# Patient Record
Sex: Female | Born: 1945 | Race: White | Hispanic: No | Smoking: Never smoker
Health system: Southern US, Community
[De-identification: ages and names within clinical notes are randomized; demographics above are authoritative.]

## PROBLEM LIST (undated history)

## (undated) DIAGNOSIS — Z972 Presence of dental prosthetic device (complete) (partial): Secondary | ICD-10-CM

## (undated) DIAGNOSIS — E785 Hyperlipidemia, unspecified: Secondary | ICD-10-CM

## (undated) HISTORY — DX: Hyperlipidemia, unspecified: E78.5

## (undated) HISTORY — PX: LAPAROSCOPIC TUBAL LIGATION: SHX1937

## (undated) HISTORY — PX: COLONOSCOPY: SHX174

---

## 2011-07-14 ENCOUNTER — Ambulatory Visit: Payer: Self-pay | Admitting: Family Medicine

## 2011-07-19 ENCOUNTER — Ambulatory Visit: Payer: Self-pay | Admitting: Family Medicine

## 2013-05-28 IMAGING — US ULTRASOUND RIGHT BREAST
1 series · 14 of 22 positions shown · non-contrast
Comparison: none

REASON FOR EXAM: AV RT ASYMMETRIC DENSITY
COMMENTS:

[Series 1: ultrasound right breast · 0.08mm/px · 14 of 22 slices shown]
[im 1/22]
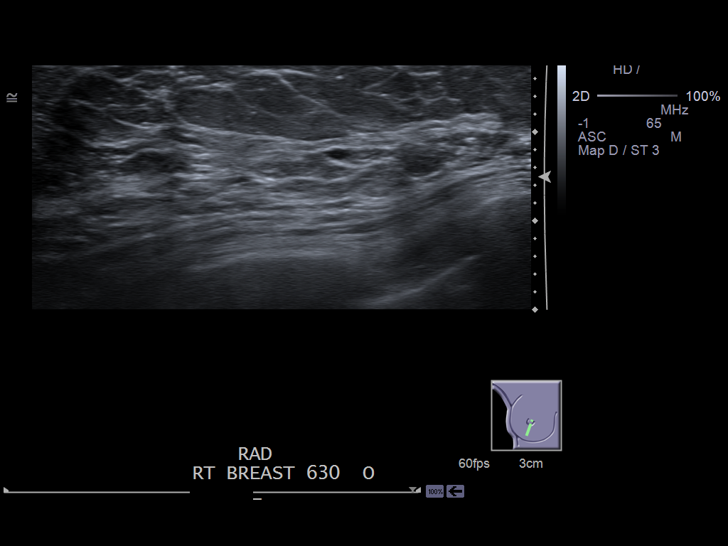
[im 3/22]
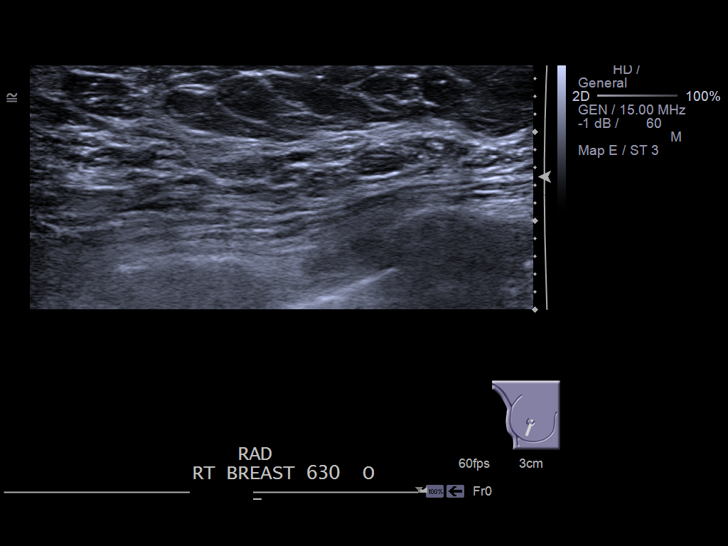
[im 4/22]
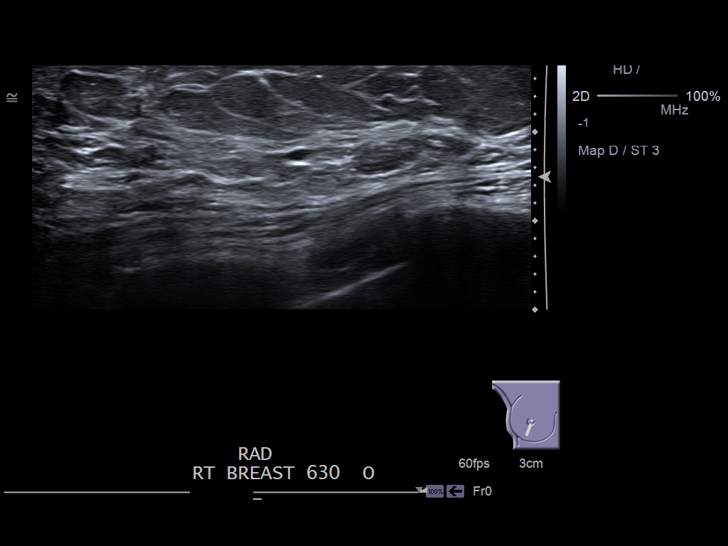
[im 6/22]
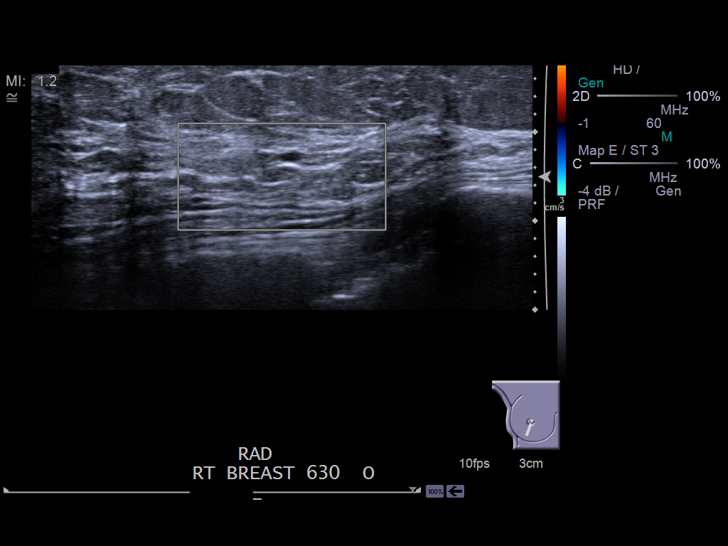
[im 8/22]
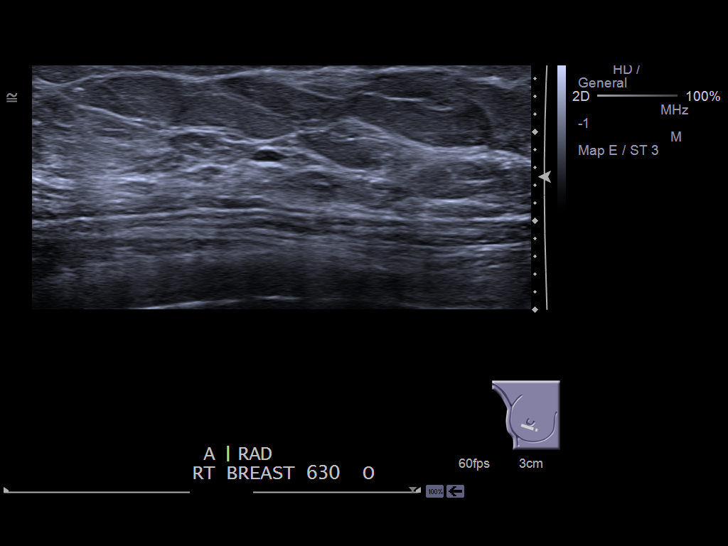
[im 9/22]
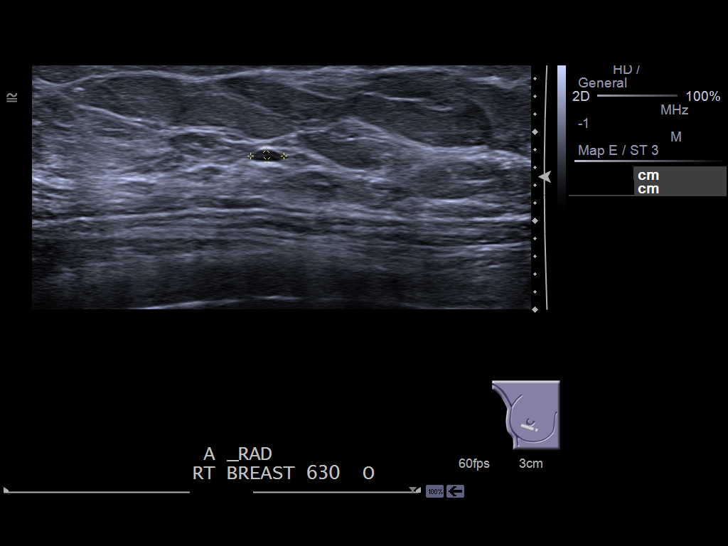
[im 11/22]
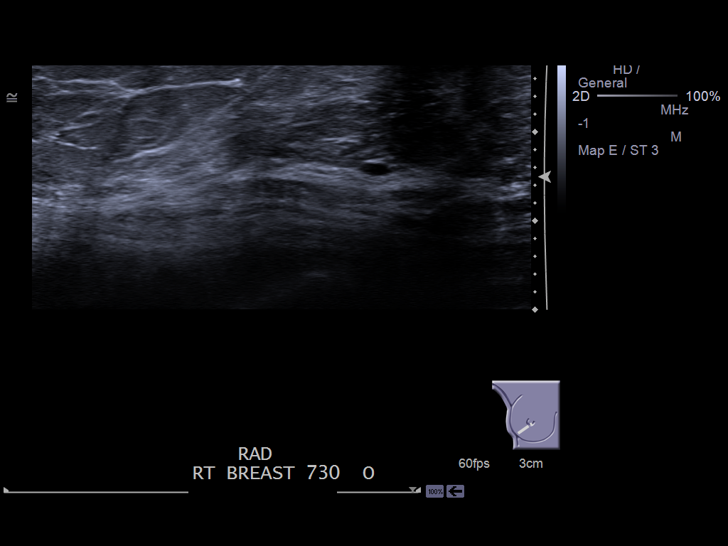
[im 12/22]
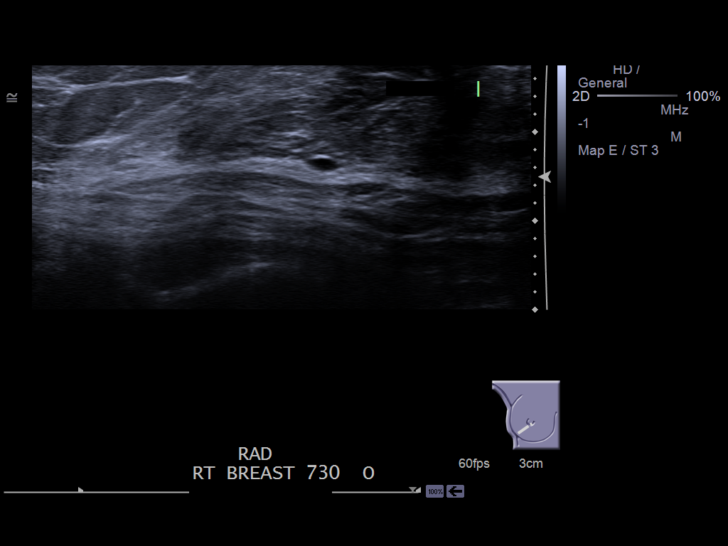
[im 14/22]
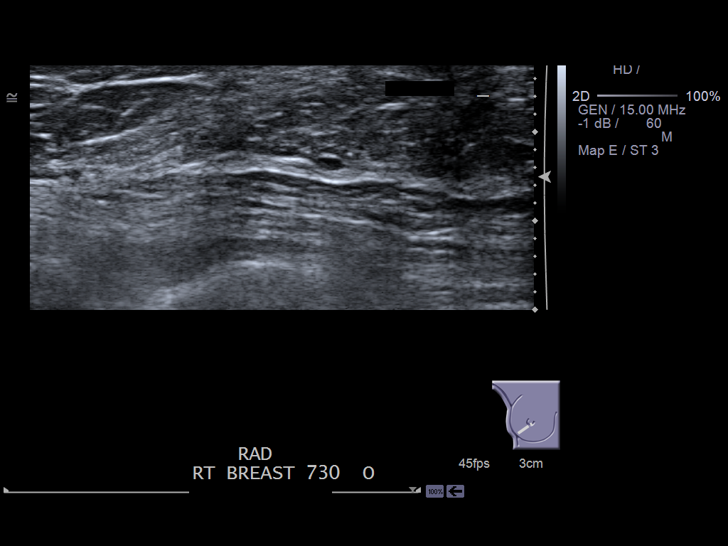
[im 15/22]
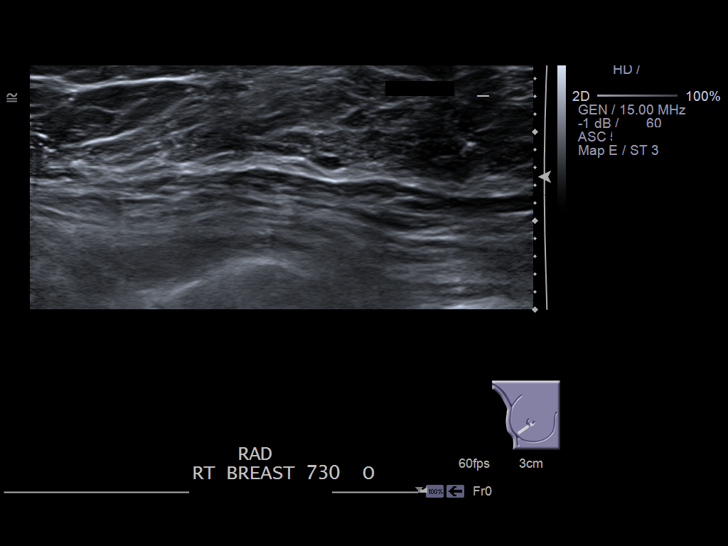
[im 17/22]
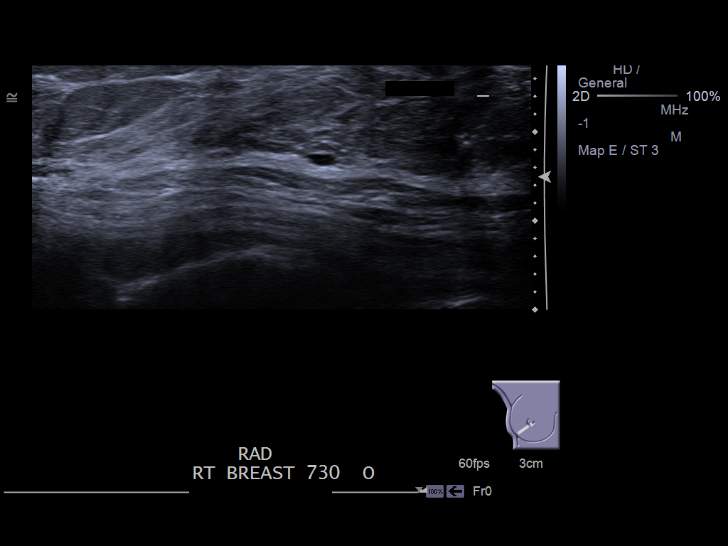
[im 19/22]
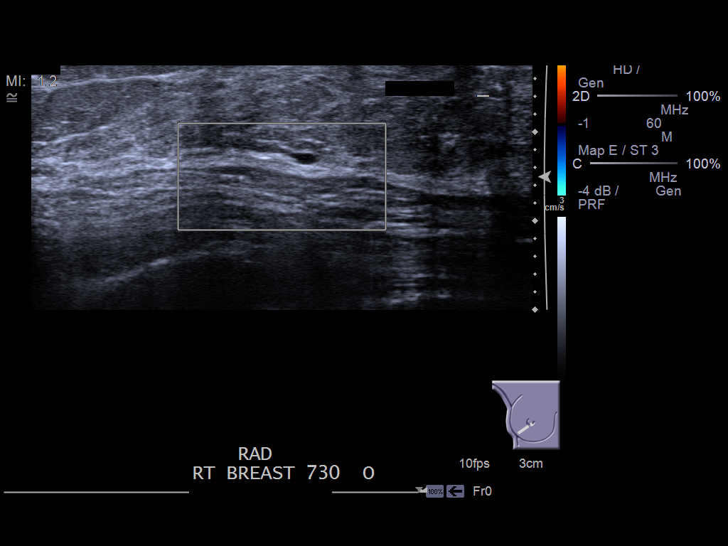
[im 20/22]
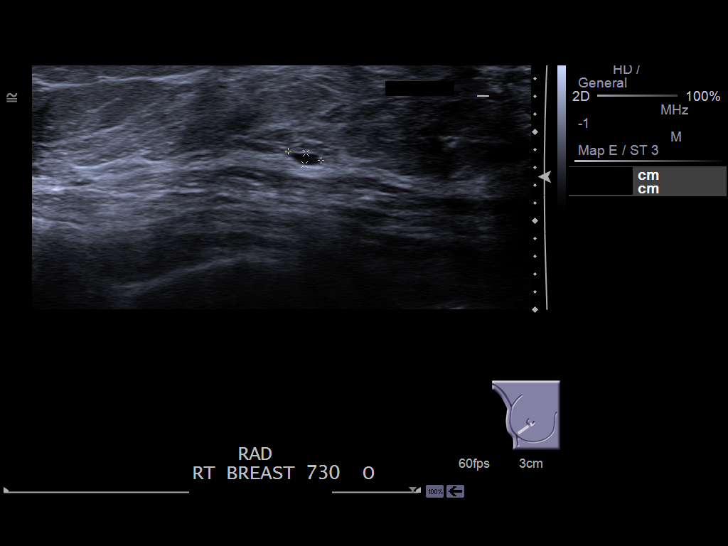
[im 22/22]
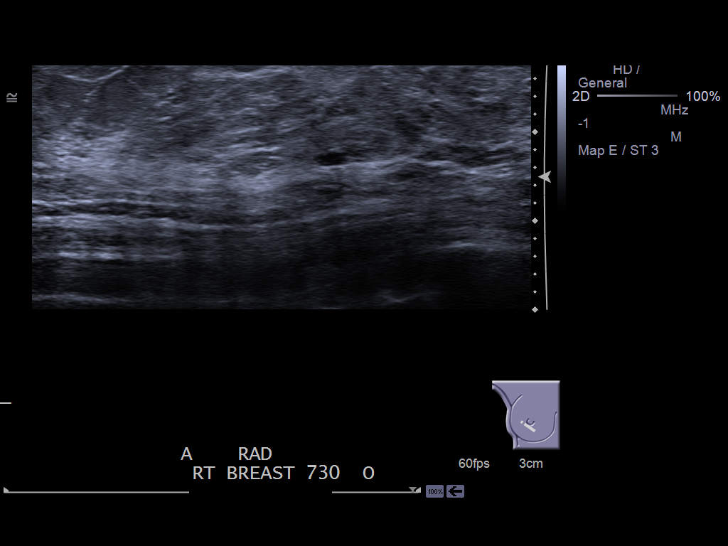

[14 of 22 positions shown; findings below may reference images not displayed]

PROCEDURE:     US  - US BREAST RIGHT  - July 19, 2011  [DATE]

RESULT:       The right breast was evaluated in the region of interest from
the [DATE] to [DATE] position. At the [DATE] position in a retroareolar location,
a small hypoechoic to anechoic oval-shaped nodule is identified.  There
appears to be an imperceptible wall and absence of associated vascularity.
The nodule measures 0.38 x 0.13 x 0.3 cm and has the appearance of a small
benign cyst.  No further solid or cystic sonographic abnormalities are
identified.
IMPRESSION: Please refer to the bilateral additional radiographic view dictation for
complete discussion.

## 2015-08-22 ENCOUNTER — Other Ambulatory Visit: Payer: Self-pay | Admitting: Family Medicine

## 2015-08-22 DIAGNOSIS — Z1231 Encounter for screening mammogram for malignant neoplasm of breast: Secondary | ICD-10-CM

## 2015-09-04 ENCOUNTER — Ambulatory Visit: Payer: Self-pay

## 2015-09-15 ENCOUNTER — Ambulatory Visit
Admission: RE | Admit: 2015-09-15 | Discharge: 2015-09-15 | Disposition: A | Discharge status: Home / Self Care | Payer: BC Managed Care – PPO | Source: Ambulatory Visit | Attending: Family Medicine | Admitting: Family Medicine

## 2015-09-15 ENCOUNTER — Other Ambulatory Visit: Payer: Self-pay | Admitting: Family Medicine

## 2015-09-15 DIAGNOSIS — Z1231 Encounter for screening mammogram for malignant neoplasm of breast: Secondary | ICD-10-CM

## 2017-07-25 IMAGING — MG MM DIGITAL SCREENING BILAT W/ TOMO W/ CAD
9 of 14 series · 9 of 30 positions shown · non-contrast
Comparison: Previous exam(s).

CLINICAL DATA: Screening.

EXAM:
2D DIGITAL SCREENING BILATERAL MAMMOGRAM WITH CAD AND ADJUNCT TOMO

[R XCCL]
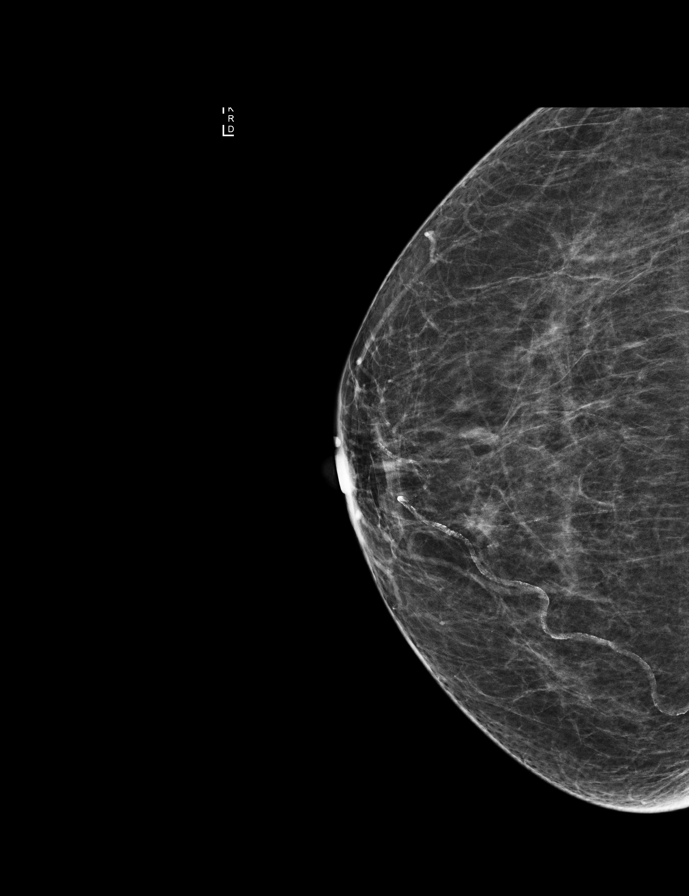

[R MLO (1 of 2)]
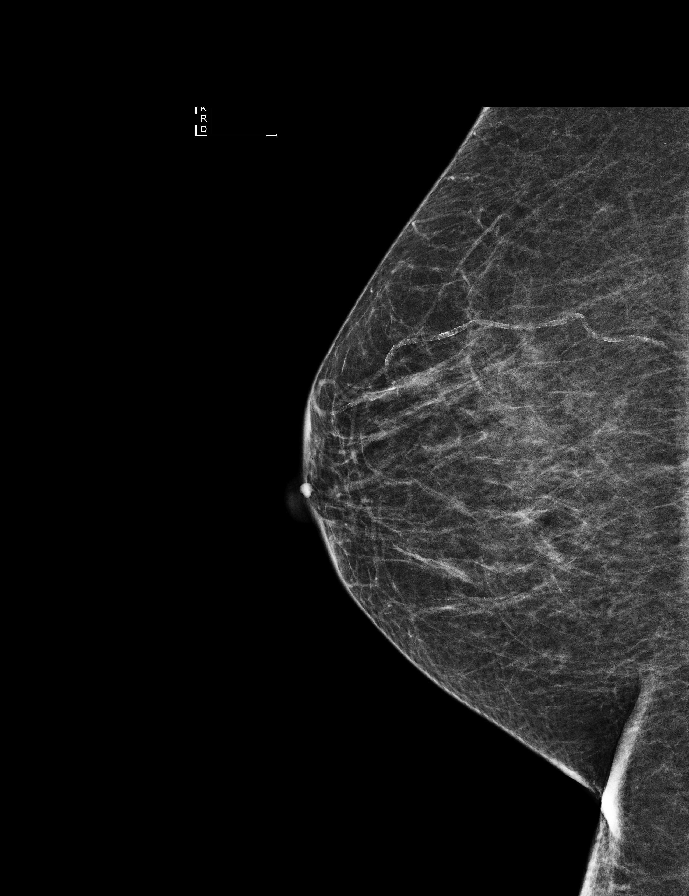

[L MLO synth-2D]
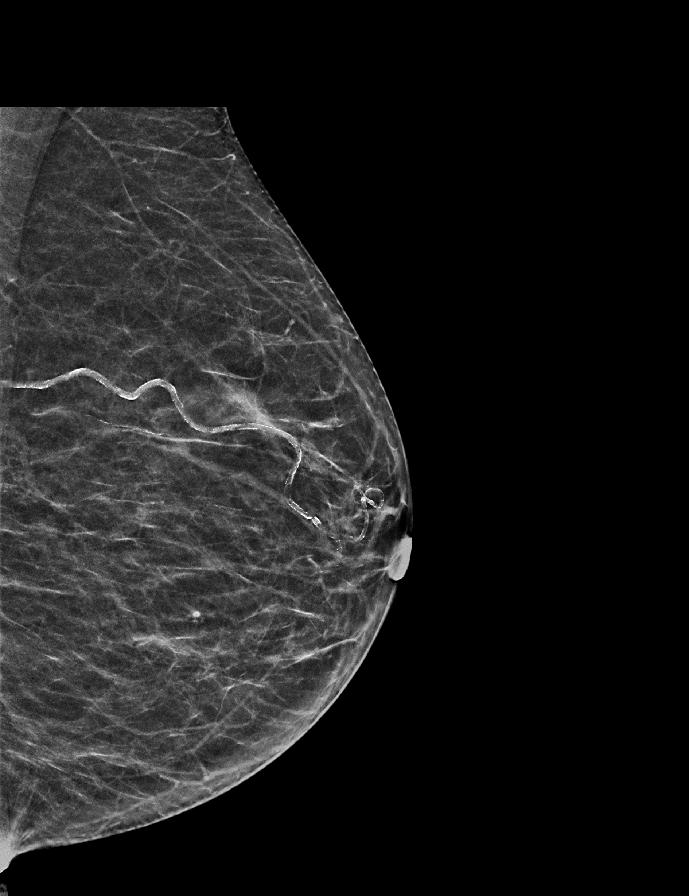

[R MLO synth-2D]
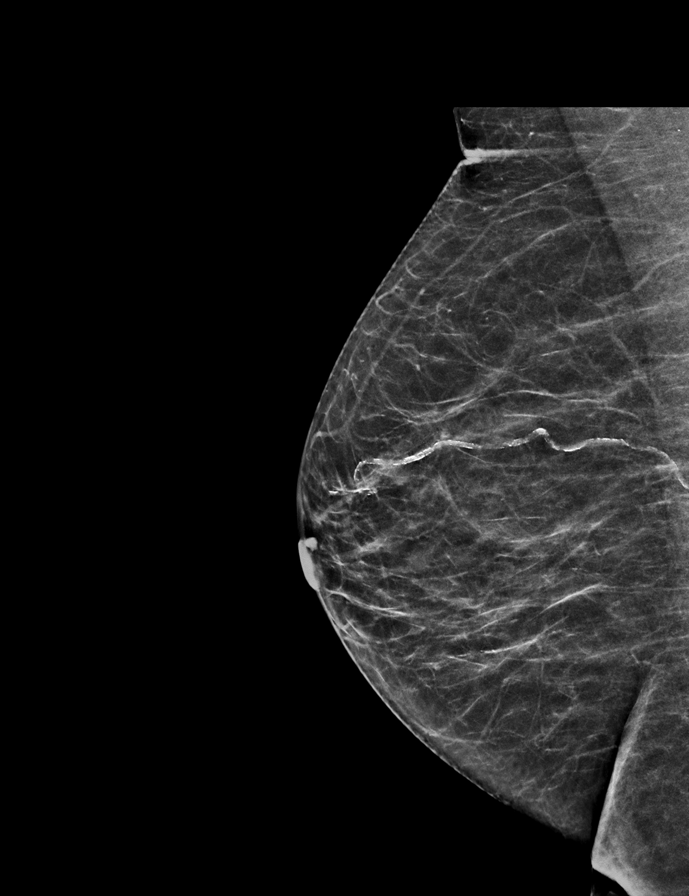

[R MLO (2 of 2)]
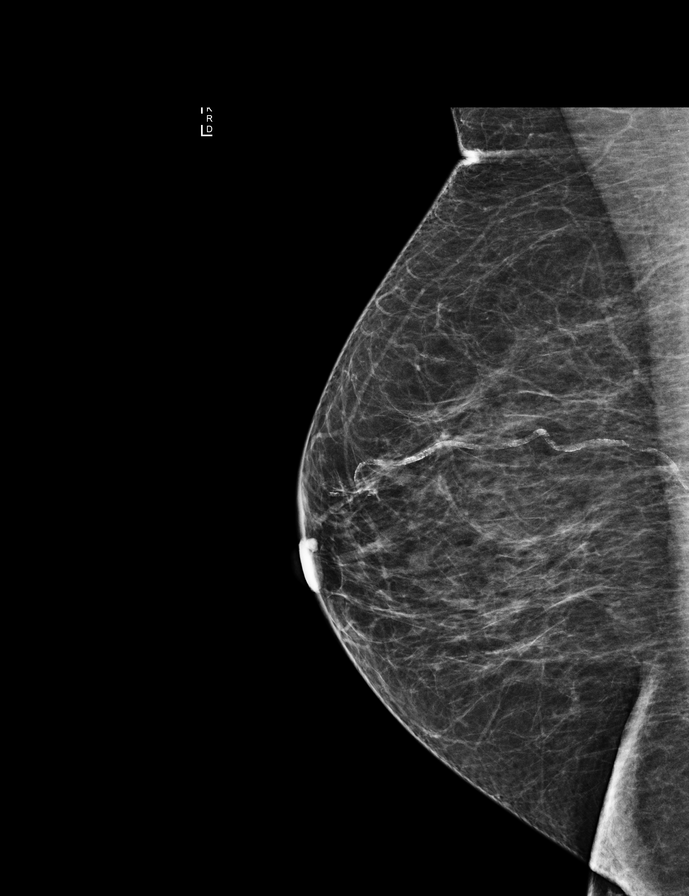

[R CC synth-2D]
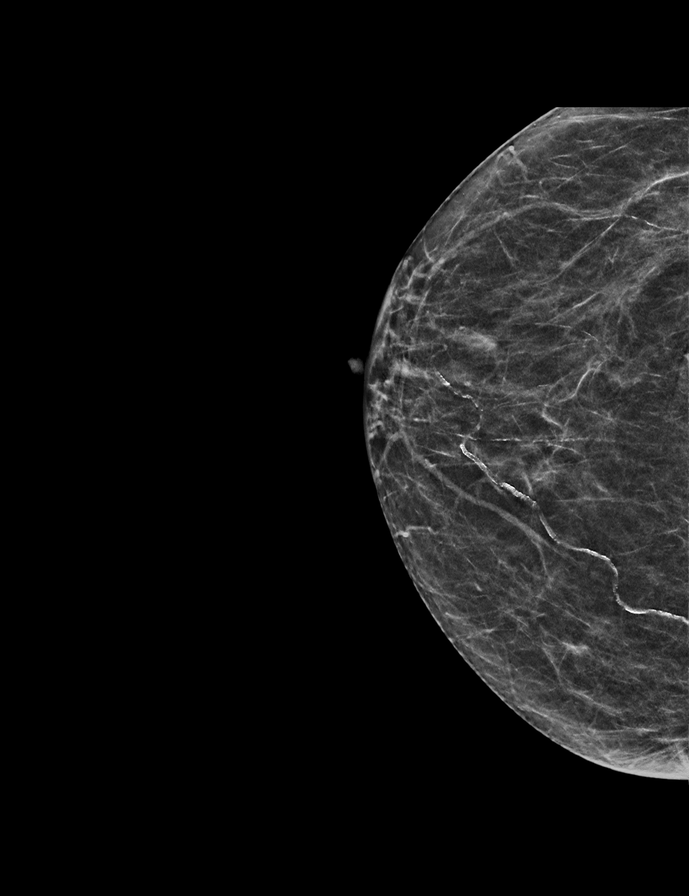

[L CC]
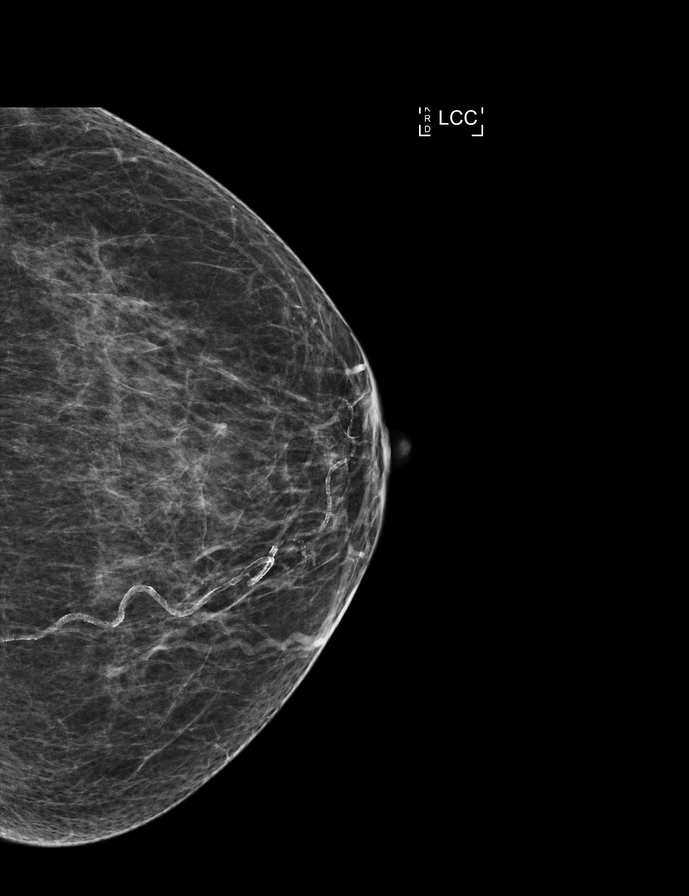

[L CC synth-2D]
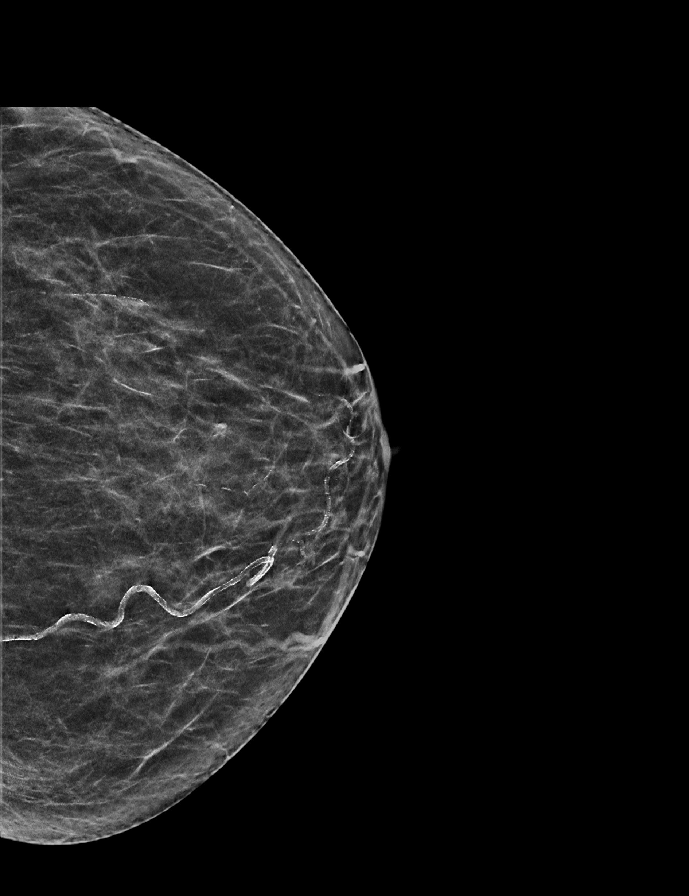

[R CC]
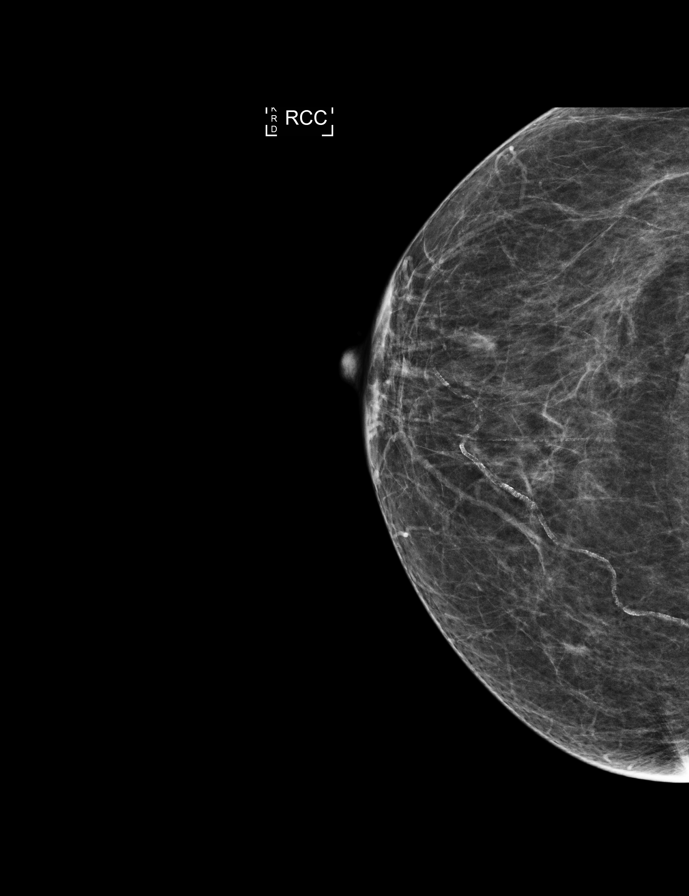

[9 of 30 positions shown; findings below may reference images not displayed]

ACR Breast Density Category b: There are scattered areas of
fibroglandular density.
FINDINGS: There are no findings suspicious for malignancy. Images were
processed with CAD.
IMPRESSION: No mammographic evidence of malignancy. A result letter of this
screening mammogram will be mailed directly to the patient.

RECOMMENDATION:
Screening mammogram in one year. (Code:97-6-RS4)

BI-RADS CATEGORY  1: Negative.

## 2018-05-02 ENCOUNTER — Other Ambulatory Visit: Payer: Self-pay

## 2018-05-02 ENCOUNTER — Ambulatory Visit (INDEPENDENT_AMBULATORY_CARE_PROVIDER_SITE_OTHER): Payer: BC Managed Care – PPO | Admitting: Podiatry

## 2018-05-02 ENCOUNTER — Ambulatory Visit: Payer: BC Managed Care – PPO

## 2018-05-02 ENCOUNTER — Encounter: Payer: Self-pay | Admitting: Podiatry

## 2018-05-02 VITALS — Temp 97.1°F

## 2018-05-02 DIAGNOSIS — B351 Tinea unguium: Secondary | ICD-10-CM

## 2018-05-02 DIAGNOSIS — L603 Nail dystrophy: Secondary | ICD-10-CM | POA: Diagnosis not present

## 2018-05-02 DIAGNOSIS — M79674 Pain in right toe(s): Secondary | ICD-10-CM | POA: Diagnosis not present

## 2018-05-02 DIAGNOSIS — M79675 Pain in left toe(s): Secondary | ICD-10-CM | POA: Diagnosis not present

## 2018-05-02 NOTE — Progress Notes (Signed)
   SUBJECTIVE Patient presents to office today complaining of elongated, thickened nails that cause pain while ambulating in shoes.  She is unable to trim her own nails. Patient is here for further evaluation and treatment.  No past medical history on file.  OBJECTIVE General Patient is awake, alert, and oriented x 3 and in no acute distress. Derm Skin is dry and supple bilateral. Negative open lesions or macerations. Remaining integument unremarkable. Nails are tender, long, thickened and dystrophic with subungual debris, consistent with onychomycosis, 1-5 bilateral. No signs of infection noted. Vasc  DP and PT pedal pulses palpable bilaterally. Temperature gradient within normal limits.  Neuro Epicritic and protective threshold sensation grossly intact bilaterally.  Musculoskeletal Exam No symptomatic pedal deformities noted bilateral. Muscular strength within normal limits.  ASSESSMENT 1. Onychodystrophic nails 1-5 bilateral with hyperkeratosis of nails.  2. Onychomycosis of nail due to dermatophyte bilateral 3. Pain in foot bilateral  PLAN OF CARE 1. Patient evaluated today.  2. Instructed to maintain good pedal hygiene and foot care.  3. Mechanical debridement of nails 1-5 bilaterally performed using a nail nipper. Filed with dremel without incident.  4. Return to clinic in 3 mos.    Brent M. Evans, DPM [facility]  Dr. Brent M. Evans, DPM    [address]                                        [place], [place] [address]                Office [phone]  Fax [phone]      

## 2018-08-01 ENCOUNTER — Ambulatory Visit: Payer: BC Managed Care – PPO | Admitting: Podiatry

## 2019-03-01 ENCOUNTER — Other Ambulatory Visit: Payer: Self-pay | Admitting: Family Medicine

## 2019-03-01 DIAGNOSIS — Z1231 Encounter for screening mammogram for malignant neoplasm of breast: Secondary | ICD-10-CM

## 2021-02-09 ENCOUNTER — Encounter: Payer: Self-pay | Admitting: Ophthalmology

## 2021-02-16 NOTE — Discharge Instructions (Signed)

## 2021-02-18 ENCOUNTER — Ambulatory Visit: Payer: BC Managed Care – PPO | Admitting: Anesthesiology

## 2021-02-18 ENCOUNTER — Other Ambulatory Visit: Payer: Self-pay

## 2021-02-18 ENCOUNTER — Encounter: Payer: Self-pay | Admitting: Ophthalmology

## 2021-02-18 ENCOUNTER — Encounter
Admission: RE | Disposition: A | Discharge status: Home / Self Care | Payer: Self-pay | Source: Home / Self Care | Attending: Ophthalmology

## 2021-02-18 ENCOUNTER — Ambulatory Visit
Admission: RE | Admit: 2021-02-18 | Discharge: 2021-02-18 | Disposition: A | Discharge status: Home / Self Care | Payer: BC Managed Care – PPO | Attending: Ophthalmology | Admitting: Ophthalmology

## 2021-02-18 DIAGNOSIS — H2511 Age-related nuclear cataract, right eye: Secondary | ICD-10-CM | POA: Diagnosis present

## 2021-02-18 HISTORY — PX: CATARACT EXTRACTION W/PHACO: SHX586

## 2021-02-18 HISTORY — DX: Presence of dental prosthetic device (complete) (partial): Z97.2

## 2021-02-18 SURGERY — PHACOEMULSIFICATION, CATARACT, WITH IOL INSERTION
Anesthesia: Monitor Anesthesia Care | Site: Eye | Laterality: Right

## 2021-02-18 MED ORDER — LACTATED RINGERS IV SOLN: INTRAVENOUS | Status: DC

## 2021-02-18 MED ORDER — BRIMONIDINE TARTRATE-TIMOLOL 0.2-0.5 % OP SOLN
OPHTHALMIC | Status: DC | PRN
  Administered 2021-02-18: 1 [drp] via OPHTHALMIC

## 2021-02-18 MED ORDER — SIGHTPATH DOSE#1 NA HYALUR & NA CHOND-NA HYALUR IO KIT
PACK | INTRAOCULAR | Status: DC | PRN
  Administered 2021-02-18: 1 via OPHTHALMIC

## 2021-02-18 MED ORDER — SIGHTPATH DOSE#1 BSS IO SOLN
INTRAOCULAR | Status: DC | PRN
  Administered 2021-02-18: 15 mL

## 2021-02-18 MED ORDER — SIGHTPATH DOSE#1 BSS IO SOLN
INTRAOCULAR | Status: DC | PRN
  Administered 2021-02-18: 69 mL via OPHTHALMIC

## 2021-02-18 MED ORDER — TETRACAINE HCL 0.5 % OP SOLN
1.0000 [drp] | OPHTHALMIC | Status: DC | PRN
  Administered 2021-02-18 (×3): 1 [drp] via OPHTHALMIC

## 2021-02-18 MED ORDER — MIDAZOLAM HCL 2 MG/2ML IJ SOLN
INTRAMUSCULAR | Status: DC | PRN
  Administered 2021-02-18 (×2): 1 mg via INTRAVENOUS

## 2021-02-18 MED ORDER — ARMC OPHTHALMIC DILATING DROPS
1.0000 "application " | OPHTHALMIC | Status: DC | PRN
  Administered 2021-02-18 (×3): 1 via OPHTHALMIC

## 2021-02-18 MED ORDER — LIDOCAINE HCL (PF) 2 % IJ SOLN
INTRAMUSCULAR | Status: DC | PRN
  Administered 2021-02-18: 1 mL

## 2021-02-18 MED ORDER — CEFUROXIME OPHTHALMIC INJECTION 1 MG/0.1 ML
INJECTION | OPHTHALMIC | Status: DC | PRN
  Administered 2021-02-18: 0.1 mL via INTRACAMERAL

## 2021-02-18 MED ORDER — FENTANYL CITRATE (PF) 100 MCG/2ML IJ SOLN
INTRAMUSCULAR | Status: DC | PRN
  Administered 2021-02-18 (×2): 50 ug via INTRAVENOUS

## 2021-02-18 SURGICAL SUPPLY — 21 items
CANNULA ANT/CHMB 27G (MISCELLANEOUS) IMPLANT
CANNULA ANT/CHMB 27GA (MISCELLANEOUS) IMPLANT
CATARACT SUITE SIGHTPATH (MISCELLANEOUS) ×2 IMPLANT
FEE CATARACT SUITE SIGHTPATH (MISCELLANEOUS) ×1 IMPLANT
GLOVE SRG 8 PF TXTR STRL LF DI (GLOVE) ×1 IMPLANT
GLOVE SURG ENC TEXT LTX SZ7.5 (GLOVE) ×2 IMPLANT
GLOVE SURG GAMMEX PI TX LF 7.5 (GLOVE) IMPLANT
GLOVE SURG UNDER POLY LF SZ8 (GLOVE) ×2
LENS IOL TECNIS EYHANCE 19.5 (Intraocular Lens) ×1 IMPLANT
NDL FILTER BLUNT 18X1 1/2 (NEEDLE) ×1 IMPLANT
NDL RETROBULBAR .5 NSTRL (NEEDLE) IMPLANT
NEEDLE FILTER BLUNT 18X 1/2SAF (NEEDLE) ×1
NEEDLE FILTER BLUNT 18X1 1/2 (NEEDLE) ×1 IMPLANT
PACK VIT ANT 23G (MISCELLANEOUS) IMPLANT
RING MALYGIN 7.0 (MISCELLANEOUS) IMPLANT
SUT ETHILON 10-0 CS-B-6CS-B-6 (SUTURE)
SUT VICRYL  9 0 (SUTURE)
SUT VICRYL 9 0 (SUTURE) IMPLANT
SUTURE EHLN 10-0 CS-B-6CS-B-6 (SUTURE) IMPLANT
SYR 3ML LL SCALE MARK (SYRINGE) ×2 IMPLANT
WATER STERILE IRR 250ML POUR (IV SOLUTION) ×2 IMPLANT

## 2021-02-18 NOTE — Anesthesia Preprocedure Evaluation (Signed)
Anesthesia Evaluation  Patient identified by MRN, date of birth, ID band Patient awake    History of Anesthesia Complications Negative for: history of anesthetic complications  Airway Mallampati: II  TM Distance: >3 FB Neck ROM: Full    Dental  (+) Edentulous Upper, Edentulous Lower   Pulmonary neg pulmonary ROS,    Pulmonary exam normal        Cardiovascular Exercise Tolerance: Good negative cardio ROS Normal cardiovascular exam     Neuro/Psych negative neurological ROS     GI/Hepatic negative GI ROS, Neg liver ROS,   Endo/Other  negative endocrine ROS  Renal/GU negative Renal ROS     Musculoskeletal negative musculoskeletal ROS (+)   Abdominal   Peds  Hematology negative hematology ROS (+)   Anesthesia Other Findings   Reproductive/Obstetrics                             Anesthesia Physical Anesthesia Plan  ASA: 1  Anesthesia Plan: MAC   Post-op Pain Management: Minimal or no pain anticipated   Induction: Intravenous  PONV Risk Score and Plan: 2 and TIVA, Treatment may vary due to age or medical condition and Midazolam  Airway Management Planned: Nasal Cannula and Natural Airway  Additional Equipment: None  Intra-op Plan:   Post-operative Plan: Extubation in OR  Informed Consent: I have reviewed the patients History and Physical, chart, labs and discussed the procedure including the risks, benefits and alternatives for the proposed anesthesia with the patient or authorized representative who has indicated his/her understanding and acceptance.       Plan Discussed with: CRNA  Anesthesia Plan Comments:         Anesthesia Quick Evaluation

## 2021-02-18 NOTE — Anesthesia Procedure Notes (Signed)
Procedure Name: MAC Date/Time: 02/18/2021 11:29 AM Performed by: Jeannene Patella, CRNA Pre-anesthesia Checklist: Patient identified, Emergency Drugs available, Suction available, Timeout performed and Patient being monitored Patient Re-evaluated:Patient Re-evaluated prior to induction Oxygen Delivery Method: Nasal cannula Placement Confirmation: positive ETCO2

## 2021-02-18 NOTE — Transfer of Care (Signed)
Immediate Anesthesia Transfer of Care Note  Patient: Cynthia Olson  Procedure(s) Performed: CATARACT EXTRACTION PHACO AND INTRAOCULAR LENS PLACEMENT (IOC) RIGHT (Right: Eye)  Patient Location: PACU  Anesthesia Type: MAC  Level of Consciousness: awake, alert  and patient cooperative  Airway and Oxygen Therapy: Patient Spontanous Breathing and Patient connected to supplemental oxygen  Post-op Assessment: Post-op Vital signs reviewed, Patient's Cardiovascular Status Stable, Respiratory Function Stable, Patent Airway and No signs of Nausea or vomiting  Post-op Vital Signs: Reviewed and stable  Complications: No notable events documented.

## 2021-02-18 NOTE — H&P (Signed)
°  [facility]   Primary Care Physician:  [place]land Pink, MD Ophthalmologist: Dr. Leandrew Koyanagi  Pre-Procedure History & Physical: HPI:  Cynthia Olson is a 76 y.o. female here for ophthalmic surgery.   Past Medical History:  Diagnosis Date   Wears dentures    full upper and lower    Past Surgical History:  Procedure Laterality Date   COLONOSCOPY      Prior to Admission medications   Medication Sig Start Date End Date Taking? Authorizing Provider  acetaminophen (TYLENOL) 325 MG tablet Take 650 mg by mouth every 6 (six) hours as needed.    [provider]    Allergies as of 01/21/2021   (No Known Allergies)    Family History  Problem Relation Age of Onset   Breast cancer Neg Hx     Social History   Socioeconomic History   Marital status: Widowed    Spouse name: Not on file   Number of children: Not on file   Years of education: Not on file   Highest education level: Not on file  Occupational History   Not on file  Tobacco Use   Smoking status: Never   Smokeless tobacco: Never  Vaping Use   Vaping Use: Never used  Substance and Sexual Activity   Alcohol use: Never   Drug use: Never   Sexual activity: Not on file  Other Topics Concern   Not on file  Social History Narrative   Not on file   Social Determinants of Health   Financial Resource Strain: Not on file  Food Insecurity: Not on file  Transportation Needs: Not on file  Physical Activity: Not on file  Stress: Not on file  Social Connections: Not on file  Intimate Partner Violence: Not on file    Review of Systems: See HPI, otherwise negative ROS  Physical Exam: BP (!) 153/98    Pulse 75    Temp 98.1 F (36.7 C) (Temporal)    Ht 5\' 5"  (1.651 m)    Wt 69.2 kg    SpO2 100%    BMI 25.39 kg/m  General:   Alert,  pleasant and cooperative in NAD Head:  Normocephalic and atraumatic. Lungs:  Clear to auscultation.    Heart:  Regular rate and rhythm.    Impression/Plan: Cynthia Olson is here for ophthalmic surgery.  Risks, benefits, limitations, and alternatives regarding ophthalmic surgery have been reviewed with the patient.  Questions have been answered.  All parties agreeable.   Leandrew Koyanagi, MD  02/18/2021, 10:19 AM

## 2021-02-18 NOTE — Op Note (Signed)
LOCATION:  [facility]   PREOPERATIVE DIAGNOSIS:    Nuclear sclerotic cataract right eye. H25.11   POSTOPERATIVE DIAGNOSIS:  Nuclear sclerotic cataract right eye.     PROCEDURE:  Phacoemusification with posterior chamber intraocular lens placement of the right eye   ULTRASOUND TIME: Procedure(s) with comments: CATARACT EXTRACTION PHACO AND INTRAOCULAR LENS PLACEMENT (IOC) RIGHT (Right) - 5.84 1:09.8  LENS:   Implant Name Type Inv. Item Serial No. Manufacturer Lot No. LRB No. Used Action  LENS IOL TECNIS EYHANCE 19.5 - [id number] Intraocular Lens LENS IOL TECNIS EYHANCE 19.5 [id number] [employer]  Right 1 Implanted         SURGEON:  Deirdre Evener, MD   ANESTHESIA:  Topical with tetracaine drops and 2% Xylocaine jelly, augmented with 1% preservative-free intracameral lidocaine.    COMPLICATIONS:  None.   DESCRIPTION OF PROCEDURE:  The patient was identified in the holding room and transported to the operating room and placed in the supine position under the operating microscope.  The right eye was identified as the operative eye and it was prepped and draped in the usual sterile ophthalmic fashion.   A 1 millimeter clear-corneal paracentesis was made at the 12:00 position.  0.5 ml of preservative-free 1% lidocaine was injected into the anterior chamber. The anterior chamber was filled with Viscoat viscoelastic.  A 2.4 millimeter keratome was used to make a near-clear corneal incision at the 9:00 position.  A curvilinear capsulorrhexis was made with a cystotome and capsulorrhexis forceps.  Balanced salt solution was used to hydrodissect and hydrodelineate the nucleus.   Phacoemulsification was then used in stop and chop fashion to remove the lens nucleus and epinucleus.  The remaining cortex was then removed using the irrigation and aspiration handpiece. Provisc was then placed into the capsular bag to distend it for lens placement.  A lens was then injected into the  capsular bag.  The remaining viscoelastic was aspirated.   Wounds were hydrated with balanced salt solution.  The anterior chamber was inflated to a physiologic pressure with balanced salt solution.  No wound leaks were noted. Cefuroxime 0.1 ml of a 10mg /ml solution was injected into the anterior chamber for a dose of 1 mg of intracameral antibiotic at the completion of the case.   Timolol and Brimonidine drops were applied to the eye.  The patient was taken to the recovery room in stable condition without complications of anesthesia or surgery.   Antoinne Spadaccini 02/18/2021, 11:48 AM

## 2021-02-18 NOTE — Anesthesia Postprocedure Evaluation (Signed)
Anesthesia Post Note  Patient: Cynthia Olson  Procedure(s) Performed: CATARACT EXTRACTION PHACO AND INTRAOCULAR LENS PLACEMENT (IOC) RIGHT (Right: Eye)     Patient location during evaluation: PACU Anesthesia Type: MAC Level of consciousness: awake and alert Pain management: pain level controlled Vital Signs Assessment: post-procedure vital signs reviewed and stable Respiratory status: spontaneous breathing Cardiovascular status: blood pressure returned to baseline Postop Assessment: no apparent nausea or vomiting, adequate PO intake and no headache Anesthetic complications: no   No notable events documented.  Adele Barthel Raeford Brandenburg

## 2021-02-19 ENCOUNTER — Encounter: Payer: Self-pay | Admitting: Ophthalmology

## 2021-02-25 ENCOUNTER — Encounter: Payer: Self-pay | Admitting: Ophthalmology

## 2021-03-02 NOTE — Discharge Instructions (Signed)

## 2021-03-04 ENCOUNTER — Ambulatory Visit: Payer: BC Managed Care – PPO | Admitting: Anesthesiology

## 2021-03-04 ENCOUNTER — Encounter: Payer: Self-pay | Admitting: Ophthalmology

## 2021-03-04 ENCOUNTER — Other Ambulatory Visit: Payer: Self-pay

## 2021-03-04 ENCOUNTER — Encounter
Admission: RE | Disposition: A | Discharge status: Home / Self Care | Payer: Self-pay | Source: Home / Self Care | Attending: Ophthalmology

## 2021-03-04 ENCOUNTER — Ambulatory Visit
Admission: RE | Admit: 2021-03-04 | Discharge: 2021-03-04 | Disposition: A | Discharge status: Home / Self Care | Payer: BC Managed Care – PPO | Attending: Ophthalmology | Admitting: Ophthalmology

## 2021-03-04 DIAGNOSIS — H2512 Age-related nuclear cataract, left eye: Secondary | ICD-10-CM | POA: Diagnosis present

## 2021-03-04 HISTORY — PX: CATARACT EXTRACTION W/PHACO: SHX586

## 2021-03-04 SURGERY — PHACOEMULSIFICATION, CATARACT, WITH IOL INSERTION
Anesthesia: Monitor Anesthesia Care | Site: Eye | Laterality: Left

## 2021-03-04 MED ORDER — SIGHTPATH DOSE#1 BSS IO SOLN
INTRAOCULAR | Status: DC | PRN
  Administered 2021-03-04: 1 mL via INTRAMUSCULAR

## 2021-03-04 MED ORDER — ACETAMINOPHEN 160 MG/5ML PO SOLN: 325.0000 mg | ORAL | Status: DC | PRN

## 2021-03-04 MED ORDER — CEFUROXIME OPHTHALMIC INJECTION 1 MG/0.1 ML
INJECTION | OPHTHALMIC | Status: DC | PRN
  Administered 2021-03-04: 1 mg via OPHTHALMIC

## 2021-03-04 MED ORDER — TETRACAINE HCL 0.5 % OP SOLN
1.0000 [drp] | OPHTHALMIC | Status: DC | PRN
  Administered 2021-03-04 (×3): 1 [drp] via OPHTHALMIC

## 2021-03-04 MED ORDER — ACETAMINOPHEN 325 MG PO TABS: 325.0000 mg | ORAL_TABLET | ORAL | Status: DC | PRN

## 2021-03-04 MED ORDER — MIDAZOLAM HCL 2 MG/2ML IJ SOLN
INTRAMUSCULAR | Status: DC | PRN
  Administered 2021-03-04: 2 mg via INTRAVENOUS

## 2021-03-04 MED ORDER — SIGHTPATH DOSE#1 BSS IO SOLN
INTRAOCULAR | Status: DC | PRN
  Administered 2021-03-04: 72 mL via OPHTHALMIC

## 2021-03-04 MED ORDER — BRIMONIDINE TARTRATE-TIMOLOL 0.2-0.5 % OP SOLN
OPHTHALMIC | Status: DC | PRN
  Administered 2021-03-04: 1 [drp] via OPHTHALMIC

## 2021-03-04 MED ORDER — ARMC OPHTHALMIC DILATING DROPS
1.0000 "application " | OPHTHALMIC | Status: DC | PRN
  Administered 2021-03-04 (×3): 1 via OPHTHALMIC

## 2021-03-04 MED ORDER — SIGHTPATH DOSE#1 BSS IO SOLN
INTRAOCULAR | Status: DC | PRN
  Administered 2021-03-04: 15 mL

## 2021-03-04 MED ORDER — ONDANSETRON HCL 4 MG/2ML IJ SOLN: 4.0000 mg | Freq: Once | INTRAMUSCULAR | Status: DC | PRN

## 2021-03-04 MED ORDER — SIGHTPATH DOSE#1 NA HYALUR & NA CHOND-NA HYALUR IO KIT
PACK | INTRAOCULAR | Status: DC | PRN
  Administered 2021-03-04: 1 via OPHTHALMIC

## 2021-03-04 MED ORDER — FENTANYL CITRATE (PF) 100 MCG/2ML IJ SOLN
INTRAMUSCULAR | Status: DC | PRN
  Administered 2021-03-04 (×2): 50 ug via INTRAVENOUS

## 2021-03-04 SURGICAL SUPPLY — 11 items
CATARACT SUITE SIGHTPATH (MISCELLANEOUS) ×2 IMPLANT
FEE CATARACT SUITE SIGHTPATH (MISCELLANEOUS) ×1 IMPLANT
GLOVE SRG 8 PF TXTR STRL LF DI (GLOVE) ×1 IMPLANT
GLOVE SURG ENC TEXT LTX SZ7.5 (GLOVE) ×2 IMPLANT
GLOVE SURG UNDER POLY LF SZ8 (GLOVE) ×2
LENS IOL TECNIS EYHANCE 20.0 (Intraocular Lens) ×1 IMPLANT
NDL FILTER BLUNT 18X1 1/2 (NEEDLE) ×1 IMPLANT
NEEDLE FILTER BLUNT 18X 1/2SAF (NEEDLE) ×1
NEEDLE FILTER BLUNT 18X1 1/2 (NEEDLE) ×1 IMPLANT
SYR 3ML LL SCALE MARK (SYRINGE) ×2 IMPLANT
WATER STERILE IRR 250ML POUR (IV SOLUTION) ×2 IMPLANT

## 2021-03-04 NOTE — Anesthesia Postprocedure Evaluation (Signed)
Anesthesia Post Note  Patient: Cynthia Olson  Procedure(s) Performed: CATARACT EXTRACTION PHACO AND INTRAOCULAR LENS PLACEMENT (IOC) LEFT (Left: Eye)     Anesthesia Post Evaluation No notable events documented.  Edwyna Ready

## 2021-03-04 NOTE — Anesthesia Preprocedure Evaluation (Signed)
Anesthesia Evaluation  Patient identified by MRN, date of birth, ID band Patient awake    History of Anesthesia Complications Negative for: history of anesthetic complications  Airway Mallampati: II  TM Distance: >3 FB Neck ROM: Full    Dental  (+) Edentulous Upper, Edentulous Lower   Pulmonary neg pulmonary ROS,    Pulmonary exam normal breath sounds clear to auscultation       Cardiovascular Exercise Tolerance: Good negative cardio ROS Normal cardiovascular exam Rhythm:Regular Rate:Normal     Neuro/Psych negative neurological ROS     GI/Hepatic negative GI ROS, Neg liver ROS,   Endo/Other  negative endocrine ROS  Renal/GU negative Renal ROS     Musculoskeletal negative musculoskeletal ROS (+)   Abdominal Normal abdominal exam  (+) - obese,  Abdomen: soft.    Peds  Hematology negative hematology ROS (+)   Anesthesia Other Findings   Reproductive/Obstetrics                             Anesthesia Physical  Anesthesia Plan  ASA: 2  Anesthesia Plan: MAC   Post-op Pain Management: Minimal or no pain anticipated   Induction: Intravenous  PONV Risk Score and Plan: 2 and TIVA, Treatment may vary due to age or medical condition and Midazolam  Airway Management Planned: Nasal Cannula and Natural Airway  Additional Equipment: None  Intra-op Plan:   Post-operative Plan: Extubation in OR  Informed Consent: I have reviewed the patients History and Physical, chart, labs and discussed the procedure including the risks, benefits and alternatives for the proposed anesthesia with the patient or authorized representative who has indicated his/her understanding and acceptance.       Plan Discussed with: CRNA  Anesthesia Plan Comments:         Anesthesia Quick Evaluation  There are no problems to display for this patient.   No flowsheet data found. No flowsheet data  found.  Risks and benefits of anesthesia discussed at length, patient or surrogate demonstrates understanding. Appropriately NPO. Plan to proceed with anesthesia.  Champ Mungo, MD 03/04/21

## 2021-03-04 NOTE — Op Note (Signed)
°  OPERATIVE NOTE  Everlene Cunning [id number] 03/04/2021   PREOPERATIVE DIAGNOSIS:  Nuclear sclerotic cataract left eye. H25.12   POSTOPERATIVE DIAGNOSIS:    Nuclear sclerotic cataract left eye.     PROCEDURE:  Phacoemusification with posterior chamber intraocular lens placement of the left eye  Ultrasound time: Procedure(s) with comments: CATARACT EXTRACTION PHACO AND INTRAOCULAR LENS PLACEMENT (IOC) LEFT (Left) - 4.06 00:49.3  LENS:   Implant Name Type Inv. Item Serial No. Manufacturer Lot No. LRB No. Used Action  LENS IOL TECNIS EYHANCE 20.0 - [id number] Intraocular Lens LENS IOL TECNIS EYHANCE 20.0 [id number] [employer]  Left 1 Implanted      SURGEON:  Deirdre Evener, MD   ANESTHESIA:  Topical with tetracaine drops and 2% Xylocaine jelly, augmented with 1% preservative-free intracameral lidocaine.    COMPLICATIONS:  None.   DESCRIPTION OF PROCEDURE:  The patient was identified in the holding room and transported to the operating room and placed in the supine position under the operating microscope.  The left eye was identified as the operative eye and it was prepped and draped in the usual sterile ophthalmic fashion.   A 1 millimeter clear-corneal paracentesis was made at the 1:30 position.  0.5 ml of preservative-free 1% lidocaine was injected into the anterior chamber.  The anterior chamber was filled with Viscoat viscoelastic.  A 2.4 millimeter keratome was used to make a near-clear corneal incision at the 10:30 position.  .  A curvilinear capsulorrhexis was made with a cystotome and capsulorrhexis forceps.  Balanced salt solution was used to hydrodissect and hydrodelineate the nucleus.   Phacoemulsification was then used in stop and chop fashion to remove the lens nucleus and epinucleus.  The remaining cortex was then removed using the irrigation and aspiration handpiece. Provisc was then placed into the capsular bag to distend it for lens placement.  A lens was then  injected into the capsular bag.  The remaining viscoelastic was aspirated.   Wounds were hydrated with balanced salt solution.  The anterior chamber was inflated to a physiologic pressure with balanced salt solution.  No wound leaks were noted. Cefuroxime 0.1 ml of a 10mg /ml solution was injected into the anterior chamber for a dose of 1 mg of intracameral antibiotic at the completion of the case.   Timolol and Brimonidine drops were applied to the eye.  The patient was taken to the recovery room in stable condition without complications of anesthesia or surgery.  Kamie Korber 03/04/2021, 12:32 PM

## 2021-03-04 NOTE — Transfer of Care (Signed)
Immediate Anesthesia Transfer of Care Note  Patient: Cynthia Olson  Procedure(s) Performed: CATARACT EXTRACTION PHACO AND INTRAOCULAR LENS PLACEMENT (IOC) LEFT (Left: Eye)  Patient Location: PACU  Anesthesia Type: MAC  Level of Consciousness: awake, alert  and patient cooperative  Airway and Oxygen Therapy: Patient Spontanous Breathing and Patient connected to supplemental oxygen  Post-op Assessment: Post-op Vital signs reviewed, Patient's Cardiovascular Status Stable, Respiratory Function Stable, Patent Airway and No signs of Nausea or vomiting  Post-op Vital Signs: Reviewed and stable  Complications: No notable events documented.

## 2021-03-04 NOTE — Anesthesia Procedure Notes (Signed)
Procedure Name: MAC Date/Time: 03/04/2021 12:11 PM Performed by: Jeannene Patella, CRNA Pre-anesthesia Checklist: Patient identified, Emergency Drugs available, Suction available, Timeout performed and Patient being monitored Patient Re-evaluated:Patient Re-evaluated prior to induction Oxygen Delivery Method: Nasal cannula Placement Confirmation: positive ETCO2

## 2021-03-04 NOTE — H&P (Signed)
°  [facility]   Primary Care Physician:  Jerl Mina, MD Ophthalmologist: Dr. Lockie Mola  Pre-Procedure History & Physical: HPI:  Cynthia Olson is a 76 y.o. female here for ophthalmic surgery.   Past Medical History:  Diagnosis Date   Wears dentures    full upper and lower    Past Surgical History:  Procedure Laterality Date   CATARACT EXTRACTION W/PHACO Right 02/18/2021   Procedure: CATARACT EXTRACTION PHACO AND INTRAOCULAR LENS PLACEMENT (IOC) RIGHT;  Surgeon: Lockie Mola, MD;  Location: [facility];  Service: Ophthalmology;  Laterality: Right;  5.84 1:09.8   COLONOSCOPY      Prior to Admission medications   Medication Sig Start Date End Date Taking? Authorizing Provider  acetaminophen (TYLENOL) 325 MG tablet Take 650 mg by mouth every 6 (six) hours as needed.   Yes [provider]    Allergies as of 01/21/2021   (No Known Allergies)    Family History  Problem Relation Age of Onset   Breast cancer Neg Hx     Social History   Socioeconomic History   Marital status: Widowed    Spouse name: Not on file   Number of children: Not on file   Years of education: Not on file   Highest education level: Not on file  Occupational History   Not on file  Tobacco Use   Smoking status: Never   Smokeless tobacco: Never  Vaping Use   Vaping Use: Never used  Substance and Sexual Activity   Alcohol use: Never   Drug use: Never   Sexual activity: Not on file  Other Topics Concern   Not on file  Social History Narrative   Not on file   Social Determinants of Health   Financial Resource Strain: Not on file  Food Insecurity: Not on file  Transportation Needs: Not on file  Physical Activity: Not on file  Stress: Not on file  Social Connections: Not on file  Intimate Partner Violence: Not on file    Review of Systems: See HPI, otherwise negative ROS  Physical Exam: BP (!) 162/85    Pulse 66    Temp (!) 97.3 F (36.3  C) (Temporal)    Ht 5\' 5"  (1.651 m)    Wt 69.4 kg    SpO2 98%    BMI 25.44 kg/m  General:   Alert,  pleasant and cooperative in NAD Head:  Normocephalic and atraumatic. Lungs:  Clear to auscultation.    Heart:  Regular rate and rhythm.   Impression/Plan: Glenola Luebbe is here for ophthalmic surgery.  Risks, benefits, limitations, and alternatives regarding ophthalmic surgery have been reviewed with the patient.  Questions have been answered.  All parties agreeable.   Larene Beach, MD  03/04/2021, 11:11 AM

## 2021-03-05 ENCOUNTER — Encounter: Payer: Self-pay | Admitting: Ophthalmology

## 2021-04-01 ENCOUNTER — Other Ambulatory Visit: Payer: Self-pay | Admitting: Family Medicine

## 2021-04-01 DIAGNOSIS — Z1231 Encounter for screening mammogram for malignant neoplasm of breast: Secondary | ICD-10-CM

## 2022-02-12 ENCOUNTER — Emergency Department: Payer: BC Managed Care – PPO

## 2022-02-12 ENCOUNTER — Other Ambulatory Visit: Payer: Self-pay

## 2022-02-12 ENCOUNTER — Emergency Department
Admission: EM | Admit: 2022-02-12 | Discharge: 2022-02-12 | Disposition: A | Discharge status: Home / Self Care | Payer: BC Managed Care – PPO | Attending: Emergency Medicine | Admitting: Emergency Medicine

## 2022-02-12 DIAGNOSIS — I771 Stricture of artery: Secondary | ICD-10-CM | POA: Diagnosis not present

## 2022-02-12 DIAGNOSIS — R55 Syncope and collapse: Secondary | ICD-10-CM

## 2022-02-12 DIAGNOSIS — W19XXXA Unspecified fall, initial encounter: Secondary | ICD-10-CM

## 2022-02-12 DIAGNOSIS — J984 Other disorders of lung: Secondary | ICD-10-CM | POA: Diagnosis not present

## 2022-02-12 DIAGNOSIS — I6523 Occlusion and stenosis of bilateral carotid arteries: Secondary | ICD-10-CM | POA: Diagnosis not present

## 2022-02-12 DIAGNOSIS — S0990XA Unspecified injury of head, initial encounter: Secondary | ICD-10-CM | POA: Diagnosis not present

## 2022-02-12 DIAGNOSIS — M4802 Spinal stenosis, cervical region: Secondary | ICD-10-CM | POA: Diagnosis not present

## 2022-02-12 DIAGNOSIS — G3189 Other specified degenerative diseases of nervous system: Secondary | ICD-10-CM | POA: Diagnosis not present

## 2022-02-12 DIAGNOSIS — W1830XA Fall on same level, unspecified, initial encounter: Secondary | ICD-10-CM | POA: Diagnosis not present

## 2022-02-12 DIAGNOSIS — I10 Essential (primary) hypertension: Secondary | ICD-10-CM | POA: Diagnosis not present

## 2022-02-12 DIAGNOSIS — R42 Dizziness and giddiness: Secondary | ICD-10-CM | POA: Diagnosis not present

## 2022-02-12 DIAGNOSIS — R918 Other nonspecific abnormal finding of lung field: Secondary | ICD-10-CM | POA: Diagnosis not present

## 2022-02-12 LAB — URINALYSIS, ROUTINE W REFLEX MICROSCOPIC
Bacteria, UA: NONE SEEN
Bilirubin Urine: NEGATIVE
Glucose, UA: NEGATIVE mg/dL
Ketones, ur: NEGATIVE mg/dL
Nitrite: NEGATIVE
Protein, ur: NEGATIVE mg/dL
Specific Gravity, Urine: 1.012 (ref 1.005–1.030)
pH: 6 (ref 5.0–8.0)

## 2022-02-12 LAB — BASIC METABOLIC PANEL
Anion gap: 7 (ref 5–15)
BUN: 16 mg/dL (ref 8–23)
CO2: 23 mmol/L (ref 22–32)
Calcium: 9.2 mg/dL (ref 8.9–10.3)
Chloride: 107 mmol/L (ref 98–111)
Creatinine, Ser: 0.75 mg/dL (ref 0.44–1.00)
GFR, Estimated: >60 mL/min (ref >60)
Glucose, Bld: 99 mg/dL (ref 70–99)
Potassium: 3.8 mmol/L (ref 3.5–5.1)
Sodium: 137 mmol/L (ref 135–145)

## 2022-02-12 LAB — CBC
HCT: 38.9 % (ref 36.0–46.0)
Hemoglobin: 12.4 g/dL (ref 12.0–15.0)
MCH: 27.8 pg (ref 26.0–34.0)
MCHC: 31.9 g/dL (ref 30.0–36.0)
MCV: 87.2 fL (ref 80.0–100.0)
Platelets: 237 10*3/uL (ref 150–400)
RBC: 4.46 MIL/uL (ref 3.87–5.11)
RDW: 12.7 % (ref 11.5–15.5)
WBC: 4.3 10*3/uL (ref 4.0–10.5)
nRBC: 0 % (ref 0.0–0.2)

## 2022-02-12 MED ORDER — IOHEXOL 350 MG/ML SOLN
75.0000 mL | Freq: Once | INTRAVENOUS | Status: AC | PRN
  Administered 2022-02-12: 75 mL via INTRAVENOUS

## 2022-02-12 MED ORDER — IOHEXOL 350 MG/ML SOLN
50.0000 mL | Freq: Once | INTRAVENOUS | Status: AC | PRN
  Administered 2022-02-12: 50 mL via INTRAVENOUS

## 2022-02-12 NOTE — ED Triage Notes (Signed)
Pt comes via EMS from work with c/o fall. 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 Pt turned really fast and got dizzy and fell. Pt states loc for few minutes. Pt unsure if she hit head. Pt did slide to floor per staff.   Pt denies any pain.   156/78 88 95% RA CBG-124 20 g left AC

## 2022-02-12 NOTE — Discharge Instructions (Addendum)
-  Overall, your blood work and CT scans are reassuring.  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  These are not life-threatening, but you will need a repeat CT scan of your chest in the next 3 to 6 months.  -You were additionally found to have some mild enlargement of the right thyroid gland.  Recommend outpatient ultrasound for further evaluation.  -In regards to your blood pressure, although it is elevated, it is not life-threatening.  Please follow-up with your primary care provider for reevaluation of your blood pressure to see if medications may benefit you.  -Please follow-up with your primary care provider within the next week for reevaluation.  -Return to the emergency department anytime if you begin to experience any new or worsening symptoms.

## 2022-02-12 NOTE — ED Provider Notes (Signed)
[facility] Provider Note    Event Date/Time   First MD Initiated Contact with Patient 02/12/22 [phone]     (approximate)   History   Chief Complaint Fall   HPI Cynthia Olson is a 77 y.o. female, no known medical history, presents to the emergency department for evaluation of syncope.  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  She states that she fell on concrete, however the wall braced her fall.  She states that she woke up immediately following the fall.  She is currently asymptomatic and is not endorsing any pain at this time.  Her only concern is her blood pressure, which is found to be high today.  It has not previously been high in the past.  Denies fever/chills, chest pain, shortness of breath, abdominal pain, leg swelling, flank pain, nausea/vomiting, diarrhea, urinary symptoms, headache, weakness, restless lesions, paresthesias, or dizziness/lightheadedness.  History Limitations: No limitations.        Physical Exam  Triage Vital Signs: ED Triage Vitals [02/12/22 0815]  Enc Vitals Group     BP (!) 169/100     Pulse Rate 82     Resp 17     Temp 98.1 F (36.7 C)     Temp Source Oral     SpO2 100 %     Weight 142 lb (64.4 kg)     Height 5\' 8"  (1.727 m)     Head Circumference      Peak Flow      Pain Score 0     Pain Loc      Pain Edu?      Excl. in [place]?     Most recent vital signs: Vitals:   02/12/22 1137 02/12/22 1230  BP: (!) 156/94 (!) 177/94  Pulse: 66 74  Resp: 18 18  Temp: 97.9 F (36.6 C) 98 F (36.7 C)  SpO2: 99% 100%    General: Awake, NAD.  Skin: Warm, dry. No rashes or lesions.  Eyes: PERRL. Conjunctivae normal.  CV: Good peripheral perfusion.  S1 and S2 present.  Grade 1 systolic murmur.  No rubs or gallops. Resp: Normal effort.  Lung sounds are clear bilaterally. Abd: Soft, non-tender. No distention.  Neuro: At  baseline. No gross neurological deficits.  5-5 strength and sensation in both upper and lower extremities. Musculoskeletal: Normal ROM of all extremities.  Physical Exam    ED Results / Procedures / Treatments  Labs (all labs ordered are listed, but only abnormal results are displayed) Labs Reviewed  URINALYSIS, ROUTINE W REFLEX MICROSCOPIC - Abnormal; Notable for the following components:      Result Value   Color, Urine YELLOW (*)    APPearance CLEAR (*)    Hgb urine dipstick SMALL (*)    Leukocytes,Ua SMALL (*)    All other components within normal limits  BASIC METABOLIC PANEL  CBC  CBG MONITORING, ED     EKG Sinus rhythm, rate of 76, no segment changes, normal QRS, no QT prolongation.    RADIOLOGY  ED Provider Interpretation: I personally reviewed and interpreted these images.  CT angio neck shows no evidence of vertebral artery dissection.  CT angio chest shows no evidence of pulmonary embolism.  CT head negative.  CT cervical spine negative.  Chest x-ray shows no evidence of trauma.  CT Angio Neck W and/or Wo Contrast  Result Date: 02/12/2022  CLINICAL DATA:  77 year old female status post fall. Dizziness. Asymmetric appearance of the proximal right vertebral artery on chest CTA 0945 hours today. EXAM: CT ANGIOGRAPHY NECK TECHNIQUE: Multidetector CT imaging of the neck was performed using the standard protocol during bolus administration of intravenous contrast. Multiplanar CT image reconstructions and MIPs were obtained to evaluate the vascular anatomy. Carotid stenosis measurements (when applicable) are obtained utilizing NASCET criteria, using the distal internal carotid diameter as the denominator. RADIATION DOSE REDUCTION: This exam was performed according to the departmental dose-optimization program which includes automated exposure control, adjustment of the mA and/or kV according to patient size and/or use of iterative reconstruction technique. CONTRAST:  17mL  OMNIPAQUE IOHEXOL 350 MG/ML SOLN COMPARISON:  Chest CTA 0945 hours today. Head and cervical spine CT this morning reported separately. FINDINGS: Skeleton: Absent dentition. Cervical spine detailed separately. No acute osseous abnormality identified. Upper chest: Stable upper lungs from CTA chest reported earlier today (please see that report) negative visible superior mediastinum. Other neck: Heterogeneous enlargement of the right thyroid lobe. Other neck soft tissue spaces appear negative. Aortic arch: Calcified aortic atherosclerosis. Heterogeneous appearance of the lateral distal arch again suspicious for penetrating thoracic aortic ulcer. See series 5, image 107 and series 9, image 112. No arch dissection. Three vessel arch configuration. Right carotid system: Negative brachiocephalic artery and right CCA origin. Tortuous proximal right CCA. Calcified plaque at the right ICA origin and bulb with less than 50 % stenosis with respect to the distal vessel. Left carotid system: Tortuous left CCA with minor calcified plaque. Minimal plaque at the left carotid bifurcation. No stenosis. Vertebral arteries: Proximal right subclavian artery calcified plaque without stenosis. Normal right vertebral artery origin but non dominant right vertebral artery with a late entry into the cervical transverse foramen, which does not correspond to the earlier CTA finding which was likely a right vertebral vein. Right vertebral artery remains diminutive but patent to the skull base. Proximal left subclavian artery and left vertebral artery origin are normal. Tortuous left V1 segment. Dominant although fairly normal caliber left vertebral artery is patent to the skull base without stenosis. Partial CTA HEAD Posterior circulation: Dominant left V4 segment. Diminutive but patent right V4 segment to the vertebrobasilar junction. No distal vertebral stenosis. Visible basilar artery is patent without stenosis. Anterior circulation: Both ICA  siphons appear patent to the termini with mild for age calcified plaque. Anatomic variants: Dominant left and diminutive right vertebral artery origins and late entry of the right vertebral and the cervical transverse foramen. Review of the MIP images confirms the above findings IMPRESSION: 1. Negative for vertebral artery dissection. Non-dominant and diminutive right vertebral artery is normal to the vertebrobasilar junction. 2. Heterogeneous appearance of the lateral distal Aortic Arch again suspicious for Penetrating Aortic Ulcer. No arch dissection. Aortic Atherosclerosis (ICD10-I70.0). 3. Mild for age cervical carotid atherosclerosis. 4. Heterogeneous enlargement of the right thyroid lobe. Recommend non-emergent thyroid ultrasound. Reference: J Am Coll Radiol. 2015 Feb;12(2): 143-50 Electronically Signed   By: Odessa Fleming M.D.   On: 02/12/2022 11:29   CT Angio Chest PE W and/or Wo Contrast  Result Date: 02/12/2022 CLINICAL DATA:  Fall.  Dizziness. EXAM: CT ANGIOGRAPHY CHEST WITH CONTRAST TECHNIQUE: Multidetector CT imaging of the chest was performed using the standard protocol during bolus administration of intravenous contrast. Multiplanar CT image reconstructions and MIPs were obtained to evaluate the vascular anatomy. RADIATION DOSE REDUCTION: This exam was performed according to the departmental dose-optimization program which includes automated exposure control, adjustment of  the mA and/or kV according to patient size and/or use of iterative reconstruction technique. CONTRAST:  13mL OMNIPAQUE IOHEXOL 350 MG/ML SOLN COMPARISON:  None Available. FINDINGS: Cardiovascular: Satisfactory opacification of the pulmonary arteries to the segmental level. No evidence of pulmonary embolism. Normal heart size. No pericardial effusion. There is asymmetrically decreased opacification of the right vertebral artery, which may be artifactual, but could also be seen in the setting of a vertebral artery dissection (series 4,  image 2). Recommend further evaluation with a dedicated CTA of the neck. 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 There are severe native coronary artery calcifications. Mediastinum/Nodes: No enlarged mediastinal, hilar, or axillary lymph nodes. Thyroid gland, trachea, and esophagus demonstrate no significant findings. Asymmetrically prominent right thyroid lobe with a dominant nodule measuring up to 9 mm. Lungs/Pleura: No pleural effusion. No pneumothorax. There is a 9 x 5 mm pulmonary nodule in the left lower lobe. There is a 4 mm solid pulmonary nodule in the inferior aspect of the right upper lobe. 4 mm nodule in the peripheral aspect of the right upper lobe (series 4, image 76) Upper Abdomen: There is mild caliectasis on the left. Musculoskeletal: No chest wall abnormality. No acute or significant osseous findings. Review of the MIP images confirms the above findings. IMPRESSION: 1. No evidence of pulmonary embolism. 2. Asymmetrically decreased opacification of the right vertebral artery, which may be artifactual, but could also be seen in the setting of a vertebral artery occlusion/dissection. Recommend further evaluation with a dedicated CTA of the neck. 3. Multiple pulmonary nodules, the largest measuring 9 x 5 mm (average diameter 7 mm) in the left lower lobe. Per Fleischner Society Guidelines, recommend a non-contrast Chest CT at 3-6 months, then consider another non-contrast Chest CT at 18-24 months. If patient is low risk for malignancy, non-contrast Chest CT at 18-24 months is optional. These guidelines do not apply to immunocompromised patients and patients with cancer. Follow up in patients with significant comorbidities as clinically warranted. For lung cancer screening, adhere to Lung-RADS guidelines. Reference:  Radiology. 2017; 284(1):228-43. 4. 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 Aortic Atherosclerosis (ICD10-I70.0). Electronically Signed   By: Lorenza Cambridge M.D.   On: 02/12/2022 10:02   CT HEAD WO CONTRAST  Result Date: 02/12/2022 CLINICAL DATA:  Fall.  Ground level fall EXAM: CT HEAD WITHOUT CONTRAST CT CERVICAL SPINE WITHOUT CONTRAST TECHNIQUE: Multidetector CT imaging of the head and cervical spine was performed following the standard protocol without intravenous contrast. Multiplanar CT image reconstructions of the cervical spine were also generated. RADIATION DOSE REDUCTION: This exam was performed according to the departmental dose-optimization program which includes automated exposure control, adjustment of the mA and/or kV according to patient size and/or use of iterative reconstruction technique. COMPARISON:  None Available. FINDINGS: CT HEAD FINDINGS Brain: No acute intracranial hemorrhage. No focal mass lesion. No CT evidence of acute infarction. No midline shift or mass effect. No hydrocephalus. Basilar cisterns are patent. There are periventricular and subcortical white matter hypodensities. Generalized cortical atrophy. 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 Vascular: No hyperdense vessel or unexpected calcification. Skull: Normal. Negative for fracture or focal lesion. Sinuses/Orbits: Paranasal sinuses and mastoid air cells are clear. Orbits are clear. Other: None. CT CERVICAL SPINE FINDINGS Alignment:  Normal alignment of the cervical vertebral bodies. Skull base and vertebrae: Normal craniocervical junction. No loss of vertebral body height or disc height. Normal facet articulation. No evidence of fracture. Soft tissues and spinal canal: No prevertebral soft tissue swelling. No perispinal or epidural hematoma. Disc levels: Disc space narrowing and  endplate sclerosis from C4-C6. No acute findings. Upper chest: Clear Other: None IMPRESSION: 1. No intracranial trauma. 2. Atrophy and white matter microvascular disease. 3. No cervical spine fracture. 4. Multilevel disc osteophytic disease. Electronically Signed   By: Genevive Bi M.D.   On: 02/12/2022 09:19   CT Cervical Spine Wo Contrast  Result Date: 02/12/2022 CLINICAL DATA:  Fall.  Ground level fall EXAM: CT HEAD WITHOUT CONTRAST CT CERVICAL SPINE WITHOUT CONTRAST TECHNIQUE: Multidetector CT imaging of the head and cervical spine was performed following the standard protocol without intravenous contrast. Multiplanar CT image reconstructions of the cervical spine were also generated. RADIATION DOSE REDUCTION: This exam was performed according to the departmental dose-optimization program which includes automated exposure control, adjustment of the mA and/or kV according to patient size and/or use of iterative reconstruction technique. COMPARISON:  None Available. FINDINGS: CT HEAD FINDINGS Brain: No acute intracranial hemorrhage. No focal mass lesion. No CT evidence of acute infarction. No midline shift or mass effect. No hydrocephalus. Basilar cisterns are patent. There are periventricular and subcortical white matter hypodensities. Generalized cortical atrophy. 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 Vascular: No hyperdense vessel or unexpected calcification. Skull: Normal. Negative for fracture or focal lesion. Sinuses/Orbits: Paranasal sinuses and mastoid air cells are clear. Orbits are clear. Other: None. CT CERVICAL SPINE FINDINGS Alignment: Normal alignment of the cervical vertebral bodies. Skull base and vertebrae: Normal craniocervical junction. No loss of vertebral body height or disc height. Normal facet articulation. No evidence of fracture. Soft tissues and spinal canal: No prevertebral soft tissue swelling. No perispinal or epidural hematoma. Disc levels: Disc space narrowing and  endplate sclerosis from C4-C6. No acute findings. Upper chest: Clear Other: None IMPRESSION: 1. No intracranial trauma. 2. Atrophy and white matter microvascular disease. 3. No cervical spine fracture. 4. Multilevel disc osteophytic disease. Electronically Signed   By: Genevive Bi M.D.   On: 02/12/2022 09:19   DG Chest 2 View  Result Date: 02/12/2022 CLINICAL DATA:  Syncope and fall EXAM: CHEST - 2 VIEW COMPARISON:  None Available. FINDINGS: Mildly hyperinflated lungs. 1.3 cm nodular density projecting over the left mid lung. No pleural effusion or pneumothorax. The heart size and mediastinal contours are within normal limits. The visualized skeletal structures are unremarkable. IMPRESSION: 1. A 1.3 cm nodular density projects over the left mid lung. Recommend further evaluation with chest CT. 2. Mildly hyperinflated lungs. Electronically Signed   By: Agustin Cree M.D.   On: 02/12/2022 09:09    PROCEDURES:  Critical Care performed: N/A.  Procedures    MEDICATIONS ORDERED IN ED: Medications  iohexol (OMNIPAQUE) 350 MG/ML injection 75 mL (75 mLs Intravenous Contrast Given 02/12/22 0940)  iohexol (OMNIPAQUE) 350 MG/ML injection 50 mL (50 mLs Intravenous Contrast Given 02/12/22 1115)     IMPRESSION / MDM / ASSESSMENT AND PLAN / ED COURSE  I reviewed the triage vital signs and the nursing notes.                              Differential diagnosis includes, but is not limited to, arrhythmias, aortic stenosis, hypertrophic cardiomyopathy, concussion, epidural/subdural hematoma, vasovagal response, orthostatic hypotension,  hypoglycemia, anemia, ACS, hypertension.   ED Course Patient appears well, vitals within normal limits at this time.  NAD.  CBC shows no leukocytosis or anemia.  BMP shows no electrolyte abnormalities or AKI.  Urinalysis shows small amounts of hemoglobin and leukocytes, otherwise no evidence of urinary tract infection.   Assessment/Plan Patient presents for evaluation  of fall secondary to syncopal episode after turning around suddenly.  She is currently not endorsing any symptoms at this time.  Vitals are within normal limits, was found to be somewhat hypertensive later on.  EKG does not show any evidence of arrhythmias.  No evidence of anemia or hypoglycemia.  CT angiogram does not show any evidence of pulmonary embolism.  CT angio neck shows no evidence of vertebral artery dissection.  There were several incidental findings, which patient was made aware of, including a penetrating aortic arch ulcer, several pulmonary nodules, and enlarged thyroid.  Provided with information regarding follow-up imaging.  However, I do not believe these findings are related to her fall/syncopal episode.  Very low suspicion for any serious or life-threatening pathology.  Advised her to follow-up with her primary care provider within the next week for reevaluation, as well as in regards to her high blood pressure to see if she needs to be started on antihypertensives.  She expressed understanding and agreed.  Will discharge.  Considered admission for this patient, but given her stable presentation and negative workup, she is unlikely benefit from admission.  Provided the patient with anticipatory guidance, return precautions, and educational material. Encouraged the patient to return to the emergency department at any time if they begin to experience any new or worsening symptoms. Patient expressed understanding and agreed with the plan.   Patient's presentation is most consistent with acute complicated illness / injury requiring diagnostic workup.       FINAL CLINICAL IMPRESSION(S) / ED DIAGNOSES   Final diagnoses:  Fall, initial encounter  Syncope, unspecified syncope type     Rx / DC Orders   ED Discharge Orders     None        Note:  This document was prepared using Dragon voice recognition software and may include unintentional dictation errors.   Teodoro Spray, [place] 02/12/22 1255    Lavonia Drafts, MD 02/12/22 1302

## 2022-03-29 ENCOUNTER — Other Ambulatory Visit: Payer: Self-pay | Admitting: Family Medicine

## 2022-03-29 DIAGNOSIS — E049 Nontoxic goiter, unspecified: Secondary | ICD-10-CM

## 2022-03-29 DIAGNOSIS — E785 Hyperlipidemia, unspecified: Secondary | ICD-10-CM | POA: Insufficient documentation

## 2022-04-01 ENCOUNTER — Ambulatory Visit
Admission: RE | Admit: 2022-04-01 | Discharge: 2022-04-01 | Disposition: A | Discharge status: Home / Self Care | Payer: BC Managed Care – PPO | Source: Ambulatory Visit | Attending: Family Medicine | Admitting: Family Medicine

## 2022-04-01 DIAGNOSIS — E049 Nontoxic goiter, unspecified: Secondary | ICD-10-CM | POA: Insufficient documentation

## 2022-04-01 DIAGNOSIS — E042 Nontoxic multinodular goiter: Secondary | ICD-10-CM | POA: Diagnosis not present

## 2022-04-08 ENCOUNTER — Other Ambulatory Visit: Payer: Self-pay | Admitting: Family Medicine

## 2022-04-08 DIAGNOSIS — Z1231 Encounter for screening mammogram for malignant neoplasm of breast: Secondary | ICD-10-CM

## 2022-06-16 NOTE — Progress Notes (Signed)
Encounter for chart reconciliation with Care Everywhere

## 2022-06-22 ENCOUNTER — Encounter: Payer: Self-pay | Admitting: Cardiovascular Disease

## 2022-06-22 ENCOUNTER — Ambulatory Visit: Payer: BC Managed Care – PPO | Attending: Cardiovascular Disease | Admitting: Cardiovascular Disease

## 2022-06-22 VITALS — BP 122/80 | HR 66 | Ht 67.0 in | Wt 159.0 lb

## 2022-06-22 DIAGNOSIS — F419 Anxiety disorder, unspecified: Secondary | ICD-10-CM

## 2022-06-22 DIAGNOSIS — I719 Aortic aneurysm of unspecified site, without rupture: Secondary | ICD-10-CM | POA: Diagnosis not present

## 2022-06-22 DIAGNOSIS — I10 Essential (primary) hypertension: Secondary | ICD-10-CM | POA: Diagnosis not present

## 2022-06-22 MED ORDER — LOSARTAN POTASSIUM 25 MG PO TABS: 25.0000 mg | ORAL_TABLET | Freq: Every day | ORAL | 3 refills | Status: AC

## 2022-06-22 MED ORDER — EZETIMIBE 10 MG PO TABS: 10.0000 mg | ORAL_TABLET | Freq: Every day | ORAL | 3 refills | Status: AC

## 2022-06-22 MED ORDER — ROSUVASTATIN CALCIUM 20 MG PO TABS: 20.0000 mg | ORAL_TABLET | Freq: Every day | ORAL | 3 refills | Status: AC

## 2022-06-22 MED ORDER — ASPIRIN 81 MG PO TBEC: 81.0000 mg | DELAYED_RELEASE_TABLET | Freq: Every day | ORAL | 3 refills | Status: AC

## 2022-06-22 NOTE — Patient Instructions (Signed)
Medication Instructions:  Asa 81 mg EC daily Crestor 20 mg daily and zetia 10 mg daily for cholesterol   Please start losartan 25 mg in the evening for high blood perssure, Wait until school is out  If you need a refill on your cardiac medications before your next appointment, please call your pharmacy.   Lab work: No new labs needed  Testing/Procedures: No new testing needed  Follow-Up: At [facility], you and your health needs are our priority.  As part of our continuing mission to provide you with exceptional heart care, we have created designated Provider Care Teams.  These Care Teams include your primary Cardiologist (physician) and Advanced Practice Providers (APPs -  Physician Assistants and Nurse Practitioners) who all work together to provide you with the care you need, when you need it.  You will need a follow up appointment in 6 months  Providers on your designated Care Team:   Nicolasa Ducking, NP Eula Listen, PA-C Cadence Fransico Michael, [place]  COVID-19 Vaccine Information can be found at: [email or web address] For questions related to vaccine distribution or appointments, please email [email or web address] or call [phone].

## 2022-06-22 NOTE — Progress Notes (Signed)
Cardiology Office Note  Date:  06/22/2022   ID:  Myshia, Blankinship [birth year], MRN [id number]  PCP:  Jerl Mina, MD   Chief Complaint  Patient presents with   New Patient (Initial Visit)    Ref by Dr. Burnett Sheng for history of HTN & penetrating ulcer of aorta. Patient c/o fluctuating blood pressure, pounding in chest, racing heart beats, shortness of breath with over exertion. Medications reviewed by the patient verbally.     HPI:  Ms. Cynthia Olson is a 77 year old woman with past medical history of Hypertension Penetrating ulcer of aorta History of fall  Anxiety Hyperlipidemia Who presents for HTN, aorta ulcer, coronary calcification, aortic atherosclerosis  In follow-up she reports feeling relatively well Concerned about finding on CT scan as detailed below  Reports having episode of near syncope episode where she turned around very quickly and almost passed out, blood pressure was very high, taken to the emergency room  She has been monitoring blood pressure at home and brings in pages of numbers Blood pressure typically elevated first thing in the morning 5:30 AM often 150 systolic, trending down by the afternoon and early evening  CT scan showing right thyroid nodule  Small inferiorly projecting outpouching at the transverse aorta just distal to the takeoff of the left subclavian artery, 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 Was pulled up and reviewed, moderate coronary calcification  Total cholesterol 211 LDL 112  EKG personally reviewed by myself on todays visit Normal sinus rhythm rate 66 bpm no significant ST-T wave changes  Strong family history coronary disease, mother, father  PMH:   has a past medical history of Hyperlipidemia and Wears dentures.  PSH:    Past Surgical History:  Procedure Laterality Date   CATARACT EXTRACTION W/PHACO Right 02/18/2021   Procedure: CATARACT EXTRACTION PHACO AND INTRAOCULAR LENS PLACEMENT (IOC) RIGHT;   Surgeon: Lockie Mola, MD;  Location: [facility];  Service: Ophthalmology;  Laterality: Right;  5.84 1:09.8   CATARACT EXTRACTION W/PHACO Left 03/04/2021   Procedure: CATARACT EXTRACTION PHACO AND INTRAOCULAR LENS PLACEMENT (IOC) LEFT;  Surgeon: Lockie Mola, MD;  Location: [facility];  Service: Ophthalmology;  Laterality: Left;  4.06 00:49.3   COLONOSCOPY     LAPAROSCOPIC TUBAL LIGATION      Current Outpatient Medications  Medication Sig Dispense Refill   acetaminophen (TYLENOL) 325 MG tablet Take 650 mg by mouth every 6 (six) hours as needed.     sertraline (ZOLOFT) 25 MG tablet Take 25 mg by mouth daily.     No current facility-administered medications for this visit.     Allergies:   Patient has no known allergies.   Social History:  The patient  reports that she has never smoked. She has never used smokeless tobacco. She reports that she does not drink alcohol and does not use drugs.   Family History:   family history includes Heart attack in her father.    Review of Systems: Review of Systems  Constitutional: Negative.   HENT: Negative.    Respiratory: Negative.    Cardiovascular: Negative.   Gastrointestinal: Negative.   Musculoskeletal: Negative.   Neurological: Negative.   Psychiatric/Behavioral: Negative.    All other systems reviewed and are negative.    PHYSICAL EXAM: VS:  BP 122/80 (BP Location: Right Arm, Patient Position: Sitting, Cuff Size: Normal)   Pulse 66   Ht 5\' 7"  (1.702 m)   Wt 159 lb (72.1 kg)   SpO2 98%   BMI 24.90 kg/m  ,  BMI Body mass index is 24.9 kg/m. GEN: Well nourished, well developed, in no acute distress HEENT: normal Neck: no JVD, carotid bruits, or masses Cardiac: RRR; no murmurs, rubs, or gallops,no edema  Respiratory:  clear to auscultation bilaterally, normal work of breathing GI: soft, nontender, nondistended, + BS MS: no deformity or atrophy Skin: warm and dry, no rash Neuro:  Strength  and sensation are intact Psych: euthymic mood, full affect   Recent Labs: 02/12/2022: BUN 16; Creatinine, Ser 0.75; Hemoglobin 12.4; Platelets 237; Potassium 3.8; Sodium 137    Lipid Panel No results found for: "CHOL", "HDL", "LDLCALC", "TRIG"    Wt Readings from Last 3 Encounters:  06/22/22 159 lb (72.1 kg)  02/12/22 142 lb (64.4 kg)  03/04/21 152 lb 14.4 oz (69.4 kg)       ASSESSMENT AND PLAN:  Problem List Items Addressed This Visit       Cardiology Problems   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   Essential hypertension     Other   Anxiety   Relevant Medications   sertraline (ZOLOFT) 25 MG tablet   Penetrating ulcer of aorta Images pulled up and reviewed on today's visit Discussed importance of aggressive blood pressure control Also recommended aggressive lipid management  Hyperlipidemia Will start Crestor 20 and Zetia 10 to achieve goal LDL less than 50  Coronary calcification Moderate calcification noted on CT scan Stressed the importance of aggressive lipid control Not a smoker, no diabetes  Essential hypertension Recommend she continue to monitor blood pressure especially as she is finishing school in the next 2 weeks Prescription sent in for losartan 25 mg to be taken in the evening given high pressures in the morning with improved pressures in the p.m. Blood pressures are somewhat labile   Total encounter time more than 60 minutes  Greater than 50% was spent in counseling and coordination of care with the patient    Signed, Dossie Arbour, M.D., Ph.D. [facility] [place], [place] [phone]

## 2022-07-21 DIAGNOSIS — R519 Headache, unspecified: Secondary | ICD-10-CM | POA: Diagnosis not present

## 2022-07-21 DIAGNOSIS — E785 Hyperlipidemia, unspecified: Secondary | ICD-10-CM | POA: Diagnosis not present

## 2022-07-21 DIAGNOSIS — R400 Somnolence: Secondary | ICD-10-CM | POA: Diagnosis not present

## 2022-07-21 DIAGNOSIS — G479 Sleep disorder, unspecified: Secondary | ICD-10-CM | POA: Diagnosis not present

## 2022-07-21 DIAGNOSIS — I10 Essential (primary) hypertension: Secondary | ICD-10-CM | POA: Diagnosis not present

## 2022-10-12 DIAGNOSIS — G4733 Obstructive sleep apnea (adult) (pediatric): Secondary | ICD-10-CM | POA: Diagnosis not present

## 2022-12-20 DIAGNOSIS — M1712 Unilateral primary osteoarthritis, left knee: Secondary | ICD-10-CM | POA: Diagnosis not present

## 2022-12-26 NOTE — Progress Notes (Deleted)
Cardiology Office Note  Date:  12/26/2022   ID:  Cynthia Olson, Cynthia Olson 1945/09/13, MRN [id number]  PCP:  Jerl Mina, MD   No chief complaint on file.   HPI:  Cynthia Olson is a 77 year old woman with past medical history of Hypertension Penetrating ulcer of aorta History of fall  Anxiety Hyperlipidemia Who presents for HTN, aorta ulcer, coronary calcification, aortic atherosclerosis  Last seen by myself in clinic May 2024  In follow-up she reports feeling relatively well Concerned about finding on CT scan as detailed below  Reports having episode of near syncope episode where she turned around very quickly and almost passed out, blood pressure was very high, taken to the emergency room  She has been monitoring blood pressure at home and brings in pages of numbers Blood pressure typically elevated first thing in the morning 5:30 AM often 150 systolic, trending down by the afternoon and early evening  CT scan showing right thyroid nodule  Small inferiorly projecting outpouching at the transverse aorta just distal to the takeoff of the left subclavian artery, 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 Was pulled up and reviewed, moderate coronary calcification  Total cholesterol 211 LDL 112  EKG personally reviewed by myself on todays visit Normal sinus rhythm rate 66 bpm no significant ST-T wave changes  Strong family history coronary disease, mother, father  PMH:   has a past medical history of Hyperlipidemia and Wears dentures.  PSH:    Past Surgical History:  Procedure Laterality Date   CATARACT EXTRACTION W/PHACO Right 02/18/2021   Procedure: CATARACT EXTRACTION PHACO AND INTRAOCULAR LENS PLACEMENT (IOC) RIGHT;  Surgeon: Lockie Mola, MD;  Location: [facility];  Service: Ophthalmology;  Laterality: Right;  5.84 1:09.8   CATARACT EXTRACTION W/PHACO Left 03/04/2021   Procedure: CATARACT EXTRACTION PHACO AND INTRAOCULAR LENS  PLACEMENT (IOC) LEFT;  Surgeon: Lockie Mola, MD;  Location: [facility];  Service: Ophthalmology;  Laterality: Left;  4.06 00:49.3   COLONOSCOPY     LAPAROSCOPIC TUBAL LIGATION      Current Outpatient Medications  Medication Sig Dispense Refill   acetaminophen (TYLENOL) 325 MG tablet Take 650 mg by mouth every 6 (six) hours as needed.     aspirin EC 81 MG tablet Take 1 tablet (81 mg total) by mouth daily. Swallow whole. 90 tablet 3   ezetimibe (ZETIA) 10 MG tablet Take 1 tablet (10 mg total) by mouth daily. 90 tablet 3   losartan (COZAAR) 25 MG tablet Take 1 tablet (25 mg total) by mouth daily. 90 tablet 3   rosuvastatin (CRESTOR) 20 MG tablet Take 1 tablet (20 mg total) by mouth daily. 90 tablet 3   sertraline (ZOLOFT) 25 MG tablet Take 25 mg by mouth daily.     No current facility-administered medications for this visit.     Allergies:   Patient has no known allergies.   Social History:  The patient  reports that she has never smoked. She has never used smokeless tobacco. She reports that she does not drink alcohol and does not use drugs.   Family History:   family history includes Heart attack in her father.    Review of Systems: Review of Systems  Constitutional: Negative.   HENT: Negative.    Respiratory: Negative.    Cardiovascular: Negative.   Gastrointestinal: Negative.   Musculoskeletal: Negative.   Neurological: Negative.   Psychiatric/Behavioral: Negative.    All other systems reviewed and are negative.    PHYSICAL EXAM: VS:  There were  no vitals taken for this visit. , BMI There is no height or weight on file to calculate BMI. GEN: Well nourished, well developed, in no acute distress HEENT: normal Neck: no JVD, carotid bruits, or masses Cardiac: RRR; no murmurs, rubs, or gallops,no edema  Respiratory:  clear to auscultation bilaterally, normal work of breathing GI: soft, nontender, nondistended, + BS MS: no deformity or atrophy Skin: warm  and dry, no rash Neuro:  Strength and sensation are intact Psych: euthymic mood, full affect   Recent Labs: 02/12/2022: BUN 16; Creatinine, Ser 0.75; Hemoglobin 12.4; Platelets 237; Potassium 3.8; Sodium 137    Lipid Panel No results found for: "CHOL", "HDL", "LDLCALC", "TRIG"    Wt Readings from Last 3 Encounters:  06/22/22 159 lb (72.1 kg)  02/12/22 142 lb (64.4 kg)  03/04/21 152 lb 14.4 oz (69.4 kg)       ASSESSMENT AND PLAN:  Problem List Items Addressed This Visit   None   Penetrating ulcer of aorta Images pulled up and reviewed on today's visit Discussed importance of aggressive blood pressure control Also recommended aggressive lipid management  Hyperlipidemia Will start Crestor 20 and Zetia 10 to achieve goal LDL less than 50  Coronary calcification Moderate calcification noted on CT scan Stressed the importance of aggressive lipid control Not a smoker, no diabetes  Essential hypertension Recommend she continue to monitor blood pressure especially as she is finishing school in the next 2 weeks Prescription sent in for losartan 25 mg to be taken in the evening given high pressures in the morning with improved pressures in the p.m. Blood pressures are somewhat labile   Total encounter time more than 60 minutes  Greater than 50% was spent in counseling and coordination of care with the patient    Signed, Dossie Arbour, M.D., Ph.D. [facility] [place], [place] [phone]

## 2022-12-27 ENCOUNTER — Encounter: Payer: Self-pay | Admitting: Cardiovascular Disease

## 2022-12-27 ENCOUNTER — Ambulatory Visit: Payer: Medicare HMO | Attending: Cardiovascular Disease | Admitting: Cardiovascular Disease

## 2022-12-27 DIAGNOSIS — E782 Mixed hyperlipidemia: Secondary | ICD-10-CM

## 2022-12-27 DIAGNOSIS — I10 Essential (primary) hypertension: Secondary | ICD-10-CM

## 2022-12-27 DIAGNOSIS — I719 Aortic aneurysm of unspecified site, without rupture: Secondary | ICD-10-CM

## 2023-03-03 ENCOUNTER — Other Ambulatory Visit: Payer: Self-pay | Admitting: Family Medicine

## 2023-03-03 DIAGNOSIS — R9389 Abnormal findings on diagnostic imaging of other specified body structures: Secondary | ICD-10-CM

## 2023-03-03 DIAGNOSIS — E041 Nontoxic single thyroid nodule: Secondary | ICD-10-CM

## 2023-03-04 NOTE — Progress Notes (Unsigned)
Cardiology Office Note  Date:  03/07/2023   ID:  Cynthia, Olson 11-09-1945, MRN [id number]  PCP:  Cynthia Mina, MD   Chief Complaint  Patient presents with   6 month follow up     "Doing well."     HPI:  Cynthia Olson is a 78 year-old woman with past medical history of Hypertension Penetrating ulcer of aorta History of fall  Anxiety Hyperlipidemia Moderate coronary calcification on CT scan chest Who presents for HTN, aorta ulcer, coronary calcification, aortic atherosclerosis  Last seen by myself in clinic May 2024 In follow-up today she reports doing well Continues to work at [employer] Has been seen by orthopedics for left knee pain, may need surgery in the future Trying to put it off for now, using a brace  Denies any chest pain or shortness of breath Blood pressure well-controlled Tolerating Crestor and Zetia, no recent lipid panel available  No near-syncope or syncope spells  Husband passed away, son lives with her  Prior CT scan reviewed Small inferiorly projecting outpouching at the transverse aorta just distal to the takeoff of the left subclavian artery, 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 Was pulled up and reviewed, moderate coronary calcification  EKG personally reviewed by myself on todays visit EKG Interpretation Date/Time:  Monday March 07 2023 16:34:25 EST Ventricular Rate:  81 PR Interval:  212 QRS Duration:  76 QT Interval:  358 QTC Calculation: 415 R Axis:   -28  Text Interpretation: Sinus rhythm with 1st degree A-V block When compared with ECG of 12-Feb-2022 08:20, PR interval has increased QRS axis Shifted left Confirmed by Cynthia Olson [phone]) on 03/07/2023 4:44:33 PM   PMH:   has a past medical history of Hyperlipidemia and Wears dentures.  PSH:    Past Surgical History:  Procedure Laterality Date   CATARACT EXTRACTION W/PHACO Right 02/18/2021   Procedure: CATARACT EXTRACTION PHACO AND  INTRAOCULAR LENS PLACEMENT (IOC) RIGHT;  Surgeon: Cynthia Mola, MD;  Location: [facility];  Service: Ophthalmology;  Laterality: Right;  5.84 1:09.8   CATARACT EXTRACTION W/PHACO Left 03/04/2021   Procedure: CATARACT EXTRACTION PHACO AND INTRAOCULAR LENS PLACEMENT (IOC) LEFT;  Surgeon: Cynthia Mola, MD;  Location: [facility];  Service: Ophthalmology;  Laterality: Left;  4.06 00:49.3   COLONOSCOPY     LAPAROSCOPIC TUBAL LIGATION      Current Outpatient Medications  Medication Sig Dispense Refill   acetaminophen (TYLENOL) 325 MG tablet Take 650 mg by mouth every 6 (six) hours as needed.     aspirin EC 81 MG tablet Take 1 tablet (81 mg total) by mouth daily. Swallow whole. 90 tablet 3   ezetimibe (ZETIA) 10 MG tablet Take 1 tablet (10 mg total) by mouth daily. 90 tablet 3   ibuprofen (ADVIL) 200 MG tablet Take 200 mg by mouth every 6 (six) hours as needed.     losartan (COZAAR) 25 MG tablet Take 1 tablet (25 mg total) by mouth daily. 90 tablet 3   rosuvastatin (CRESTOR) 20 MG tablet Take 1 tablet (20 mg total) by mouth daily. 90 tablet 3   sertraline (ZOLOFT) 25 MG tablet Take 25 mg by mouth daily.     No current facility-administered medications for this visit.    Allergies:   Patient has no known allergies.   Social History:  The patient  reports that she has never smoked. She has never used smokeless tobacco. She reports that she does not drink alcohol and does not use drugs.  Family History:   family history includes Heart attack in her father.    Review of Systems: Review of Systems  Constitutional: Negative.   HENT: Negative.    Respiratory: Negative.    Cardiovascular: Negative.   Gastrointestinal: Negative.   Musculoskeletal: Negative.   Neurological: Negative.   Psychiatric/Behavioral: Negative.    All other systems reviewed and are negative.  PHYSICAL EXAM: VS:  BP 118/80 (BP Location: Left Arm, Patient Position: Sitting, Cuff Size:  Normal)   Pulse 81   Ht 5\' 8"  (1.727 m)   Wt 165 lb (74.8 kg)   SpO2 97%   BMI 25.09 kg/m  , BMI Body mass index is 25.09 kg/m. Constitutional:  oriented to person, place, and time. No distress.  HENT:  Head: Grossly normal Eyes:  no discharge. No scleral icterus.  Neck: No JVD, no carotid bruits  Cardiovascular: Regular rate and rhythm, no murmurs appreciated Pulmonary/Chest: Clear to auscultation bilaterally, no wheezes or rails Abdominal: Soft.  no distension.  no tenderness.  Musculoskeletal: Normal range of motion Neurological:  normal muscle tone. Coordination normal. No atrophy Skin: Skin warm and dry Psychiatric: normal affect, pleasant  Recent Labs: No results found for requested labs within last 365 days.    Lipid Panel No results found for: "CHOL", "HDL", "LDLCALC", "TRIG"    Wt Readings from Last 3 Encounters:  03/07/23 165 lb (74.8 kg)  06/22/22 159 lb (72.1 kg)  02/12/22 142 lb (64.4 kg)     ASSESSMENT AND PLAN:  Problem List Items Addressed This Visit       Cardiology Problems   Penetrating ulcer of aorta (HCC)   Essential hypertension - Primary   Relevant Orders   EKG 12-Lead (Completed)   Hyperlipidemia    Penetrating ulcer of aorta Blood pressure well-controlled Tolerating Crestor 20 with Zetia 10 daily No further imaging needed at this time  Hyperlipidemia Recommend she continue Crestor 20 and Zetia 10 daily She prefers to have lab work done through primary care  Coronary calcification Moderate calcification noted on CT scan Not a smoker, no diabetes On aggressive medication regiment for hyperlipidemia  Essential hypertension Blood pressure is well controlled on today's visit. No changes made to the medications.   Signed, Cynthia Olson, M.D., Ph.D. [facility] [place], [place] [phone]

## 2023-03-07 ENCOUNTER — Ambulatory Visit: Payer: Medicare HMO | Attending: Cardiovascular Disease | Admitting: Cardiovascular Disease

## 2023-03-07 ENCOUNTER — Encounter: Payer: Self-pay | Admitting: Cardiovascular Disease

## 2023-03-07 VITALS — BP 118/80 | HR 81 | Ht 68.0 in | Wt 165.0 lb

## 2023-03-07 DIAGNOSIS — I719 Aortic aneurysm of unspecified site, without rupture: Secondary | ICD-10-CM | POA: Diagnosis not present

## 2023-03-07 DIAGNOSIS — E782 Mixed hyperlipidemia: Secondary | ICD-10-CM | POA: Diagnosis not present

## 2023-03-07 DIAGNOSIS — I10 Essential (primary) hypertension: Secondary | ICD-10-CM

## 2023-03-07 NOTE — Patient Instructions (Signed)
 Medication Instructions:  No changes  If you need a refill on your cardiac medications before your next appointment, please call your pharmacy.   Lab work: No new labs needed  Testing/Procedures: No new testing needed  Follow-Up: At [facility], you and your health needs are our priority.  As part of our continuing mission to provide you with exceptional heart care, we have created designated Provider Care Teams.  These Care Teams include your primary Cardiologist (physician) and Advanced Practice Providers (APPs -  Physician Assistants and Nurse Practitioners) who all work together to provide you with the care you need, when you need it.  You will need a follow up appointment in 12 months  Providers on your designated Care Team:   Nicolasa Ducking, NP Eula Listen, PA-C Cadence Fransico Michael, [place]  COVID-19 Vaccine Information can be found at: [email or web address] For questions related to vaccine distribution or appointments, please email [email or web address] or call [phone].

## 2023-03-15 ENCOUNTER — Ambulatory Visit
Admission: RE | Admit: 2023-03-15 | Discharge: 2023-03-15 | Disposition: A | Discharge status: Home / Self Care | Payer: Medicare HMO | Source: Ambulatory Visit | Attending: Family Medicine | Admitting: Family Medicine

## 2023-03-15 DIAGNOSIS — E041 Nontoxic single thyroid nodule: Secondary | ICD-10-CM | POA: Diagnosis present

## 2023-03-15 DIAGNOSIS — R9389 Abnormal findings on diagnostic imaging of other specified body structures: Secondary | ICD-10-CM
